# Patient Record
Sex: Male | Born: 1965 | Race: White | Hispanic: No | Marital: Married | Smoking: Current every day smoker
Health system: Southern US, Community
[De-identification: ages and names within clinical notes are randomized; demographics above are authoritative.]

## PROBLEM LIST (undated history)

## (undated) DIAGNOSIS — I10 Essential (primary) hypertension: Secondary | ICD-10-CM

## (undated) DIAGNOSIS — G459 Transient cerebral ischemic attack, unspecified: Secondary | ICD-10-CM

## (undated) DIAGNOSIS — K219 Gastro-esophageal reflux disease without esophagitis: Secondary | ICD-10-CM

## (undated) DIAGNOSIS — R0600 Dyspnea, unspecified: Secondary | ICD-10-CM

## (undated) DIAGNOSIS — Z8719 Personal history of other diseases of the digestive system: Secondary | ICD-10-CM

## (undated) DIAGNOSIS — R519 Headache, unspecified: Secondary | ICD-10-CM

## (undated) DIAGNOSIS — G4730 Sleep apnea, unspecified: Secondary | ICD-10-CM

## (undated) DIAGNOSIS — T8859XA Other complications of anesthesia, initial encounter: Secondary | ICD-10-CM

## (undated) DIAGNOSIS — I639 Cerebral infarction, unspecified: Secondary | ICD-10-CM

## (undated) HISTORY — PX: HERNIA REPAIR: SHX51

## (undated) HISTORY — PX: CHOLECYSTECTOMY: SHX55

## (undated) HISTORY — DX: Cerebral infarction, unspecified: I63.9

## (undated) HISTORY — PX: TONSILLECTOMY: SUR1361

## (undated) HISTORY — PX: LAMINECTOMY AND MICRODISCECTOMY SPINE: SHX1914

## (undated) HISTORY — PX: HIATAL HERNIA REPAIR: SHX195

## (undated) HISTORY — PX: ANEURYSM COILING: SHX5349

## (undated) HISTORY — PX: BACK SURGERY: SHX140

---

## 2014-09-19 DIAGNOSIS — Q248 Other specified congenital malformations of heart: Secondary | ICD-10-CM

## 2014-09-19 HISTORY — DX: Other specified congenital malformations of heart: Q24.8

## 2019-02-17 ENCOUNTER — Observation Stay (HOSPITAL_COMMUNITY)
Admission: EM | Admit: 2019-02-17 | Discharge: 2019-02-18 | Disposition: A | Discharge status: Home / Self Care | Payer: No Typology Code available for payment source | Attending: Neurosurgery | Admitting: Neurosurgery

## 2019-02-17 ENCOUNTER — Encounter (HOSPITAL_COMMUNITY): Payer: Self-pay | Admitting: Neurosurgery

## 2019-02-17 ENCOUNTER — Other Ambulatory Visit: Payer: Self-pay

## 2019-02-17 ENCOUNTER — Emergency Department (HOSPITAL_COMMUNITY): Payer: No Typology Code available for payment source

## 2019-02-17 DIAGNOSIS — Z7982 Long term (current) use of aspirin: Secondary | ICD-10-CM | POA: Insufficient documentation

## 2019-02-17 DIAGNOSIS — R0600 Dyspnea, unspecified: Secondary | ICD-10-CM | POA: Insufficient documentation

## 2019-02-17 DIAGNOSIS — Z9889 Other specified postprocedural states: Secondary | ICD-10-CM | POA: Diagnosis not present

## 2019-02-17 DIAGNOSIS — Z885 Allergy status to narcotic agent status: Secondary | ICD-10-CM | POA: Insufficient documentation

## 2019-02-17 DIAGNOSIS — R609 Edema, unspecified: Secondary | ICD-10-CM | POA: Diagnosis not present

## 2019-02-17 DIAGNOSIS — Z79899 Other long term (current) drug therapy: Secondary | ICD-10-CM | POA: Insufficient documentation

## 2019-02-17 DIAGNOSIS — Z888 Allergy status to other drugs, medicaments and biological substances status: Secondary | ICD-10-CM | POA: Diagnosis not present

## 2019-02-17 DIAGNOSIS — Z20822 Contact with and (suspected) exposure to covid-19: Secondary | ICD-10-CM | POA: Insufficient documentation

## 2019-02-17 DIAGNOSIS — R1310 Dysphagia, unspecified: Secondary | ICD-10-CM | POA: Diagnosis present

## 2019-02-17 LAB — BASIC METABOLIC PANEL
Anion gap: 10 (ref 5–15)
BUN: 15 mg/dL (ref 6–20)
CO2: 23 mmol/L (ref 22–32)
Calcium: 8.9 mg/dL (ref 8.9–10.3)
Chloride: 100 mmol/L (ref 98–111)
Creatinine, Ser: 0.95 mg/dL (ref 0.61–1.24)
GFR calc Af Amer: >60 mL/min (ref >60)
GFR calc non Af Amer: >60 mL/min (ref >60)
Glucose, Bld: 97 mg/dL (ref 70–99)
Potassium: 4.1 mmol/L (ref 3.5–5.1)
Sodium: 133 mmol/L — ABNORMAL LOW (ref 135–145)

## 2019-02-17 LAB — CBC
HCT: 50.3 % (ref 39.0–52.0)
Hemoglobin: 17.6 g/dL — ABNORMAL HIGH (ref 13.0–17.0)
MCH: 33 pg (ref 26.0–34.0)
MCHC: 35 g/dL (ref 30.0–36.0)
MCV: 94.4 fL (ref 80.0–100.0)
Platelets: 189 10*3/uL (ref 150–400)
RBC: 5.33 MIL/uL (ref 4.22–5.81)
RDW: 11.8 % (ref 11.5–15.5)
WBC: 12.3 10*3/uL — ABNORMAL HIGH (ref 4.0–10.5)
nRBC: 0 % (ref 0.0–0.2)

## 2019-02-17 LAB — SARS CORONAVIRUS 2 (TAT 6-24 HRS): SARS Coronavirus 2: NEGATIVE

## 2019-02-17 LAB — I-STAT CREATININE, ED: Creatinine, Ser: 0.9 mg/dL (ref 0.61–1.24)

## 2019-02-17 IMAGING — CT CT NECK W/ CM
4 of 6 series · 14 of 33 positions shown, 16 images · IV contrast (omnipaque)
Comparison: Cervical spine radiographs [DATE]

CLINICAL DATA: Neck swelling post neck surgery; mass, lump or
swelling, maxface. Additional history provided: Patient reports
difficulty swallowing since cervical neck fusion 2 days ago with
anterior approach, patient reports developing throat tightness and
feels as if throat is closing.

EXAM:
CT NECK WITH CONTRAST
TECHNIQUE: Multidetector CT imaging of the neck was performed using the
standard protocol following the bolus administration of intravenous
contrast.
CONTRAST:  75mL OMNIPAQUE IOHEXOL 300 MG/ML  SOLN

[Series 3: neck 2.0 st · axial · 0.64mm/px · z∈[-249,-117]mm · 3 of 133 slices shown, 4 images (1 of 3)]
[im 34/133  soft-tissue]
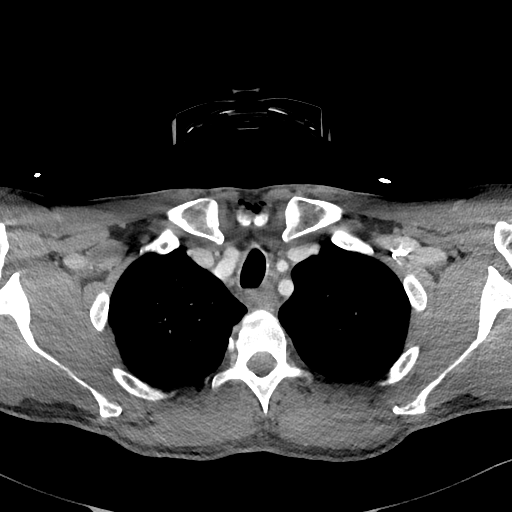
[im 34/133  bone]
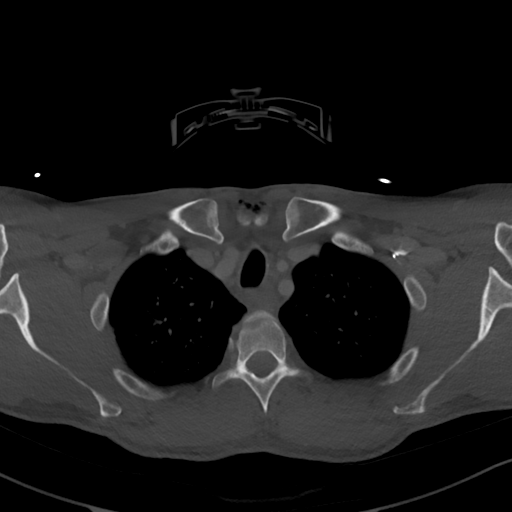
[im 67/133  bone]
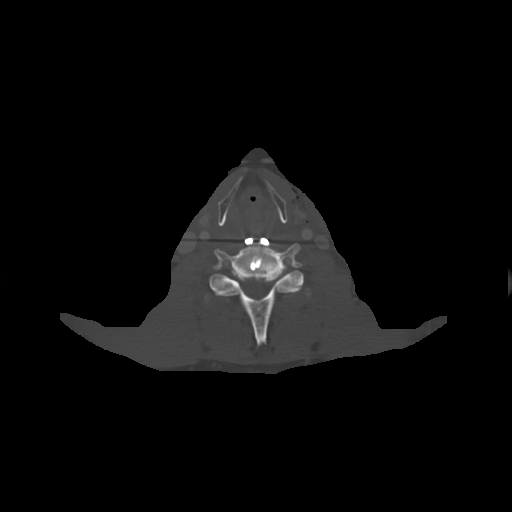
[im 100/133  bone]
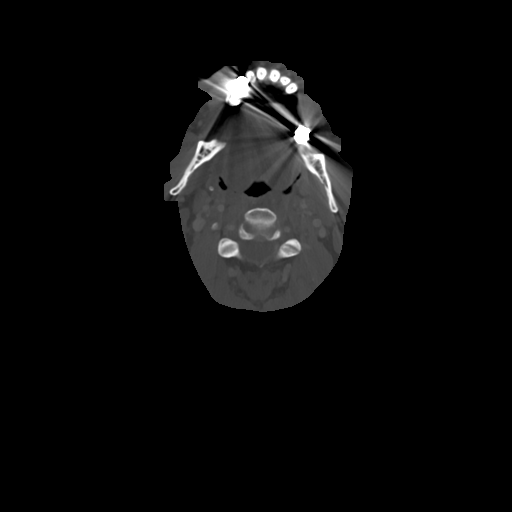

[Series 5: neck 2.0 st · sagittal · 0.52mm/px · 5 of 101 slices shown, 6 images (2 of 3)]
[im 34/101  bone]
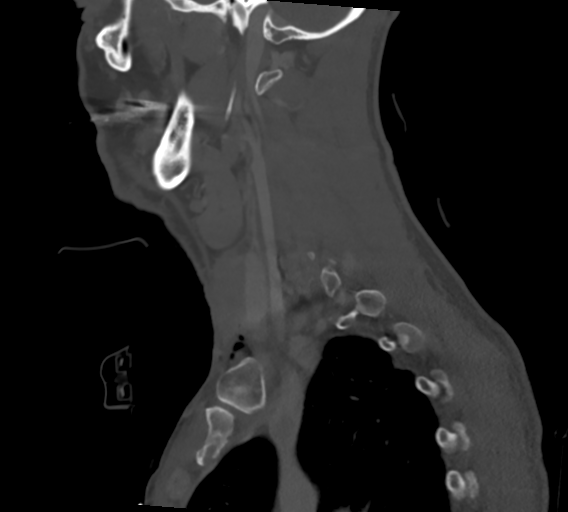
[im 42/101  bone]
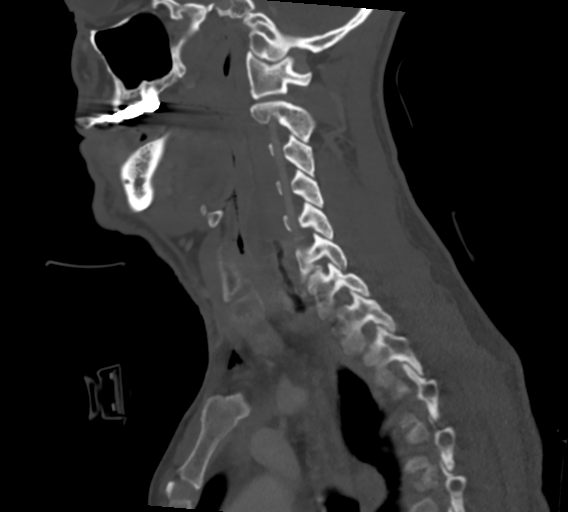
[im 51/101  soft-tissue]
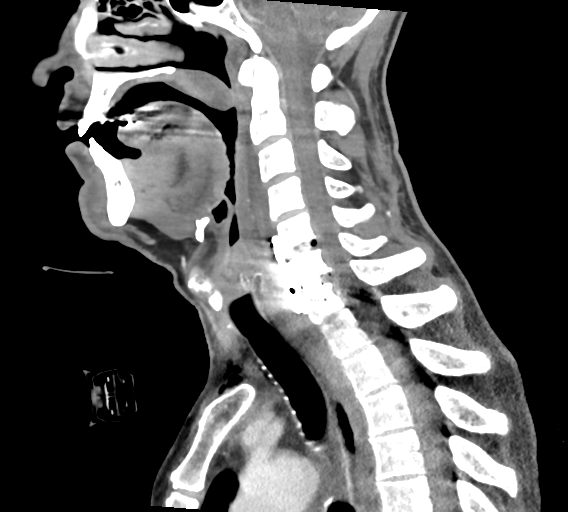
[im 51/101  bone]
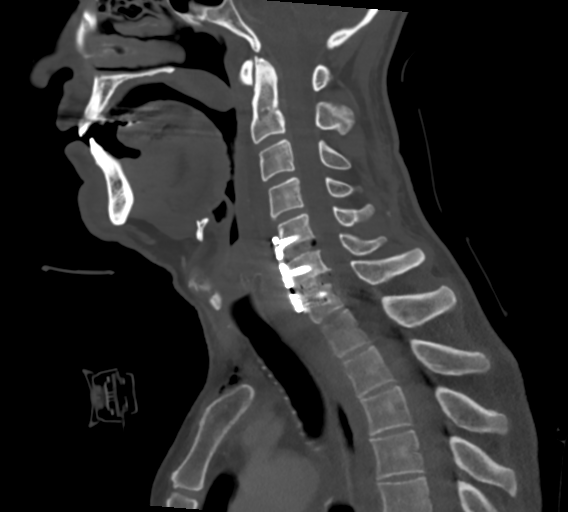
[im 59/101  bone]
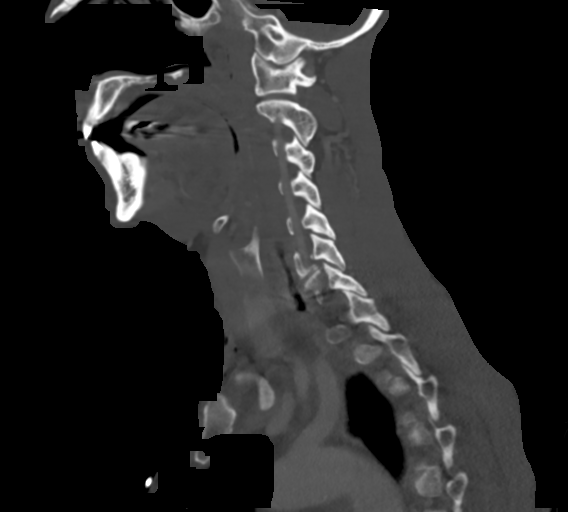
[im 67/101  bone]
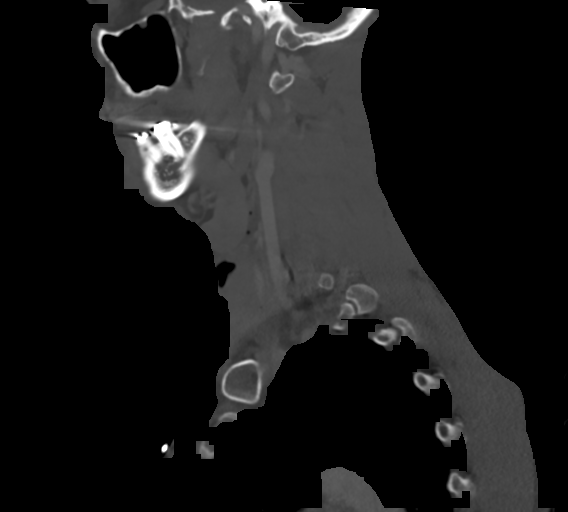

[Series 6: neck 2.0 st · coronal · 0.58mm/px · 3 of 132 slices shown (3 of 3)]
[im 27/132  bone]
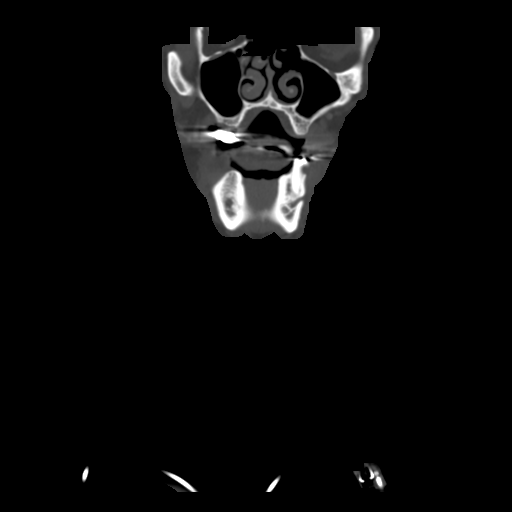
[im 53/132  bone]
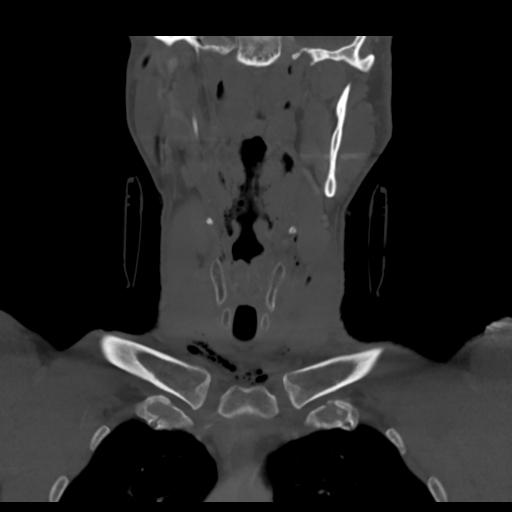
[im 79/132  bone]
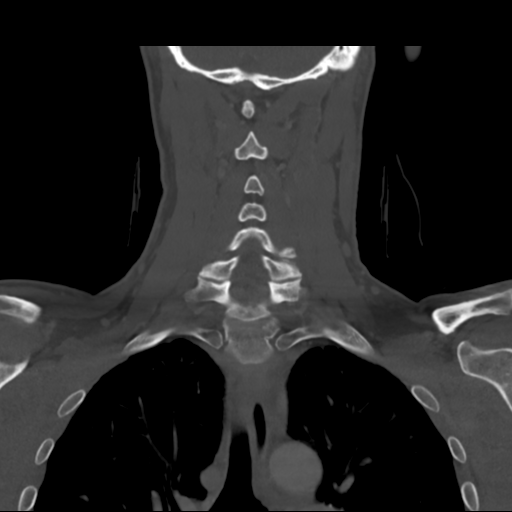

[Series 7: neck 2.0 st orthogonal · axial · 0.39mm/px · z∈[-268,-121]mm · 3 of 149 slices shown]
[im 38/149  bone]
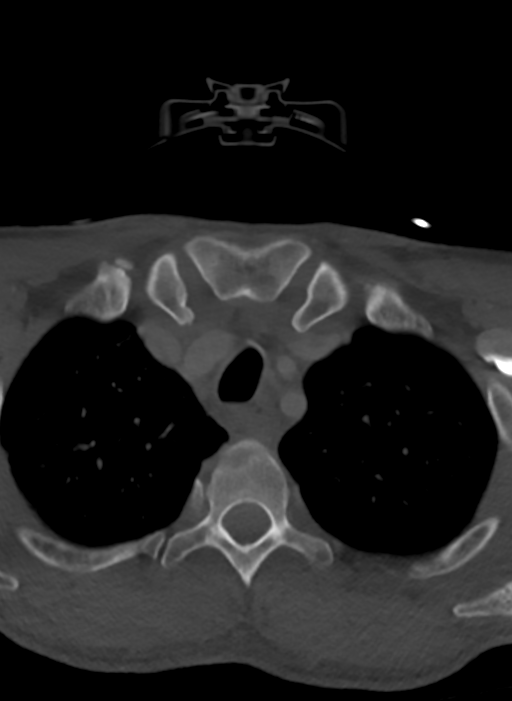
[im 75/149  bone]
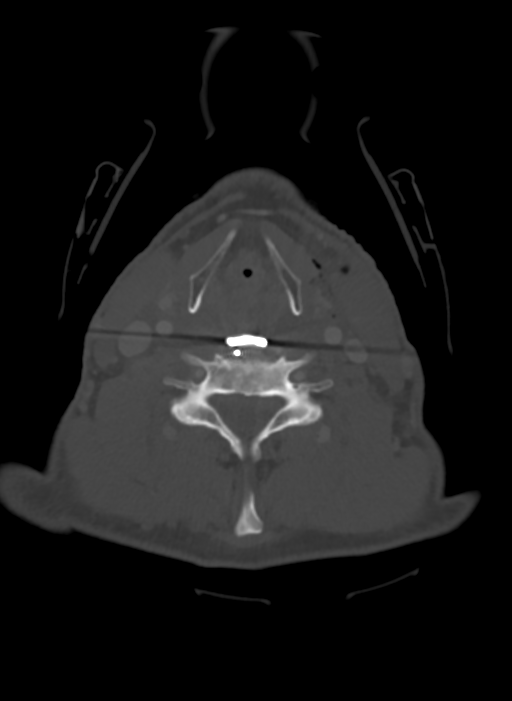
[im 112/149  bone]
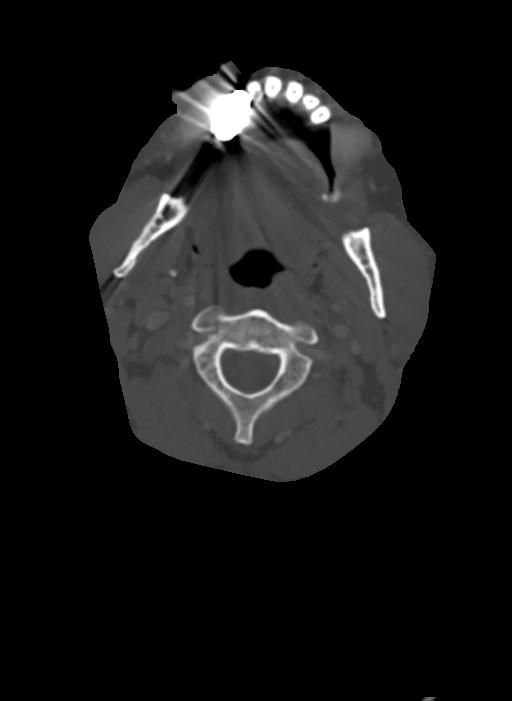

[14 of 33 positions shown; findings below may reference images not displayed]

FINDINGS: Pharynx and larynx:

Streak artifact from ACDF hardware somewhat limits evaluation of the
prevertebral soft tissues at the fused levels.

There is prevertebral soft tissue swelling and prevertebral
edema/hematoma extending from the C2 level caudally to the C7 level.
The prevertebral soft tissues measure up to 15 mm in AP dimension at
the C4 level. There is also prominent swelling of the supraglottic
laryngeal soft tissues. In combination, this results in at least
moderate effacement of the supraglottic airway (series 5, image 49)
(series 3, image 67). There is a small amount of gas within the
C5-C6 and C6-C7 disc spaces from recent procedure.

Salivary glands: No inflammation, mass, or stone.

Thyroid: Unremarkable

Lymph nodes: No pathologically enlarged cervical chain lymph nodes.

Vascular: The major vascular structures of the neck appear patent.

Limited intracranial: No abnormality identified.

Visualized orbits: Visualized orbits demonstrate no acute
abnormality.

Mastoids and visualized paranasal sinuses: Moderate ethmoid sinus
mucosal thickening no significant mastoid effusion.

Skeleton: There has been interval ACDF at the C5-C7 levels. No acute
bony abnormality.

Upper chest: No consolidation within the imaged lung apices.

Other: Scattered gas within the neck soft tissues consistent with
provided history of recent neck surgery.

These results were called by telephone at the time of interpretation
on [DATE] at [DATE] to provider [REDACTED], who verbally
acknowledged these results.
IMPRESSION: Interval C5-C7 ACDF.

Prevertebral soft tissue swelling with prevertebral edema/hematoma
extending from the C2 level caudally to the C7 level. The
prevertebral soft tissues measure up to 15 mm in AP dimension at the
C4 level. There is also prominent swelling of the supraglottic
laryngeal soft tissues in combination, this results in at least
moderate effacement of the supraglottic airway.

## 2019-02-17 MED ORDER — ONDANSETRON HCL 4 MG PO TABS: 4.0000 mg | ORAL_TABLET | Freq: Four times a day (QID) | ORAL | Status: DC | PRN

## 2019-02-17 MED ORDER — BISACODYL 10 MG RE SUPP: 10.0000 mg | Freq: Every day | RECTAL | Status: DC | PRN

## 2019-02-17 MED ORDER — DOCUSATE SODIUM 100 MG PO CAPS
100.0000 mg | ORAL_CAPSULE | Freq: Two times a day (BID) | ORAL | Status: DC
  Administered 2019-02-18: 100 mg via ORAL
  Filled 2019-02-17: qty 1

## 2019-02-17 MED ORDER — DEXAMETHASONE SODIUM PHOSPHATE 10 MG/ML IJ SOLN
10.0000 mg | Freq: Once | INTRAMUSCULAR | Status: AC
  Administered 2019-02-17: 10 mg via INTRAVENOUS
  Filled 2019-02-17: qty 1

## 2019-02-17 MED ORDER — ACETAMINOPHEN 650 MG RE SUPP: 650.0000 mg | Freq: Four times a day (QID) | RECTAL | Status: DC | PRN

## 2019-02-17 MED ORDER — ACETAMINOPHEN 325 MG PO TABS: 650.0000 mg | ORAL_TABLET | Freq: Four times a day (QID) | ORAL | Status: DC | PRN

## 2019-02-17 MED ORDER — POLYETHYLENE GLYCOL 3350 17 G PO PACK: 17.0000 g | PACK | Freq: Every day | ORAL | Status: DC | PRN

## 2019-02-17 MED ORDER — DEXAMETHASONE SODIUM PHOSPHATE 10 MG/ML IJ SOLN: 10.0000 mg | Freq: Once | INTRAMUSCULAR | Status: DC

## 2019-02-17 MED ORDER — DEXAMETHASONE SODIUM PHOSPHATE 4 MG/ML IJ SOLN
4.0000 mg | Freq: Four times a day (QID) | INTRAMUSCULAR | Status: DC
  Administered 2019-02-17 – 2019-02-18 (×4): 4 mg via INTRAVENOUS
  Filled 2019-02-17 (×4): qty 1

## 2019-02-17 MED ORDER — IOHEXOL 300 MG/ML  SOLN
75.0000 mL | Freq: Once | INTRAMUSCULAR | Status: AC | PRN
  Administered 2019-02-17: 75 mL via INTRAVENOUS

## 2019-02-17 MED ORDER — OXYCODONE HCL 5 MG PO TABS
5.0000 mg | ORAL_TABLET | ORAL | Status: DC | PRN
  Administered 2019-02-18: 5 mg via ORAL
  Filled 2019-02-17: qty 1

## 2019-02-17 MED ORDER — GUAIFENESIN 100 MG/5ML PO SOLN
5.0000 mL | ORAL | Status: DC | PRN
  Filled 2019-02-17: qty 5

## 2019-02-17 MED ORDER — SODIUM CHLORIDE 0.9 % IV SOLN: INTRAVENOUS | Status: DC

## 2019-02-17 MED ORDER — FLEET ENEMA 7-19 GM/118ML RE ENEM: 1.0000 | ENEMA | Freq: Once | RECTAL | Status: DC | PRN

## 2019-02-17 MED ORDER — ONDANSETRON HCL 4 MG/2ML IJ SOLN: 4.0000 mg | Freq: Four times a day (QID) | INTRAMUSCULAR | Status: DC | PRN

## 2019-02-17 MED ORDER — VARENICLINE TARTRATE 1 MG PO TABS
1.0000 mg | ORAL_TABLET | Freq: Two times a day (BID) | ORAL | Status: DC
  Administered 2019-02-18: 1 mg via ORAL
  Filled 2019-02-17 (×3): qty 1

## 2019-02-17 MED ORDER — FENTANYL CITRATE (PF) 100 MCG/2ML IJ SOLN
25.0000 ug | Freq: Once | INTRAMUSCULAR | Status: AC
  Administered 2019-02-17: 25 ug via INTRAVENOUS
  Filled 2019-02-17: qty 2

## 2019-02-17 MED ORDER — VARENICLINE TARTRATE 0.5 MG X 11 & 1 MG X 42 PO MISC: 1.0000 | ORAL | Status: DC

## 2019-02-17 NOTE — ED Provider Notes (Signed)
[facility]GENC[facility] DEPARTMENT Provider Note   CSN: [id number] Arrival date & time: 02/17/19  1424     History No chief complaint on file.   Chad Mcintyre is a 54 y.o. male presenting for evaluation of difficulty swallowing and breathing.  Patient states he had surgery on Friday, 2 days ago with Dr. Vertell Limber.  He had a cervical fusion performed.  Since then, he has had gradual worsening feeling that something is pressing on his throat making it hard for him to swallow and breathe.  Patient states he is choking on water.  He reports discomfort at the area, mostly when he tries to swallow.  He was able to take a Tylenol 3 this morning, was not able to take one this afternoon.  He denies fevers, chills, nausea, vomiting.  He called Dr. Melven Sartorius office, and it was told to come to the ER for further evaluation.  He is not currently on any blood thinners, his aspirin was stopped 1 week prior to his surgery.  HPI     No past medical history on file.  Patient Active Problem List   Diagnosis Date Noted  . Dysphagia 02/17/2019        No family history on file.  Social History   Tobacco Use  . Smoking status: Not on file  Substance Use Topics  . Alcohol use: Not on file  . Drug use: Not on file    Home Medications Prior to Admission medications   Medication Sig Start Date End Date Taking? Authorizing Provider  acetaminophen-codeine (TYLENOL #3) 300-30 MG tablet Take 1 tablet by mouth every 8 (eight) hours as needed for moderate pain.   Yes [provider]  aspirin 81 MG EC tablet Take 81 mg by mouth daily.   Yes [provider]  methocarbamol (ROBAXIN) 500 MG tablet Take 500 mg by mouth 4 (four) times daily.   Yes [provider]  varenicline (CHANTIX STARTING MONTH PAK) 0.5 MG X 11 & 1 MG X 42 tablet Take 0.5-1 mg by mouth See admin instructions. Take one 0.5 mg tablet by mouth once daily for 3 days, then increase to one 0.5 mg tablet twice  daily for 4 days, then increase to one 1 mg tablet twice daily.   Yes [provider]    Allergies    Oxycodone-acetaminophen and Valproic acid  Review of Systems   Review of Systems  HENT:       Difficulty swallowing  Musculoskeletal: Positive for neck pain (Anterior).  All other systems reviewed and are negative.   Physical Exam Updated Vital Signs BP (!) 133/92   Pulse 99   Temp 99.2 F (37.3 C) (Oral)   Resp (!) 24   SpO2 92%   Physical Exam Vitals and nursing note reviewed.  Constitutional:      Appearance: He is well-developed.     Comments: Appears uncomfortable  HENT:     Head: Normocephalic.     Comments: Choosing to spit up secretions instead of swallow due to difficulty swallowing.  Airway intact and that patient is able to speak, but voice is hoarse, and patient has pain with speaking. Eyes:     Conjunctiva/sclera: Conjunctivae normal.     Pupils: Pupils are equal, round, and reactive to light.  Neck:      Comments: Tenderness and swelling, mostly of the right anterior neck.  No warmth or erythema to indicate infection Cardiovascular:     Rate and Rhythm: Regular rhythm.  Tachycardia present.     Pulses: Normal pulses.     Comments: Mildly tachycardic 105 Pulmonary:     Effort: Pulmonary effort is normal. No respiratory distress.     Breath sounds: Normal breath sounds. No wheezing.     Comments: Clear lung sounds.  No stridor Abdominal:     General: There is no distension.     Palpations: Abdomen is soft. There is no mass.     Tenderness: There is no abdominal tenderness. There is no guarding or rebound.  Musculoskeletal:        General: Normal range of motion.     Cervical back: Normal range of motion and neck supple.  Skin:    General: Skin is warm and dry.  Neurological:     Mental Status: He is alert and oriented to person, place, and time.     ED Results / Procedures / Treatments   Labs (all labs ordered are listed, but only  abnormal results are displayed) Labs Reviewed  CBC - Abnormal; Notable for the following components:      Result Value   WBC 12.3 (*)    Hemoglobin 17.6 (*)    All other components within normal limits  BASIC METABOLIC PANEL - Abnormal; Notable for the following components:   Sodium 133 (*)    All other components within normal limits  SARS CORONAVIRUS 2 (TAT 6-24 HRS)  HIV ANTIBODY (ROUTINE TESTING W REFLEX)  I-STAT CREATININE, ED    EKG None  Radiology CT Soft Tissue Neck W Contrast  Result Date: 02/17/2019 CLINICAL DATA:  Neck swelling post neck surgery; mass, lump or swelling, maxface. Additional history provided: Patient reports difficulty swallowing since cervical neck fusion 2 days ago with anterior approach, patient reports developing throat tightness and feels as if throat is closing. EXAM: CT NECK WITH CONTRAST TECHNIQUE: Multidetector CT imaging of the neck was performed using the standard protocol following the bolus administration of intravenous contrast. CONTRAST:  63mL OMNIPAQUE IOHEXOL 300 MG/ML  SOLN COMPARISON:  Cervical spine radiographs 02/15/2019 FINDINGS: Pharynx and larynx: Streak artifact from ACDF hardware somewhat limits evaluation of the prevertebral soft tissues at the fused levels. There is prevertebral soft tissue swelling and prevertebral edema/hematoma extending from the C2 level caudally to the C7 level. The prevertebral soft tissues measure up to 15 mm in AP dimension at the C4 level. There is also prominent swelling of the supraglottic laryngeal soft tissues. In combination, this results in at least moderate effacement of the supraglottic airway (series 5, image 49) (series 3, image 67). There is a small amount of gas within the C5-C6 and C6-C7 disc spaces from recent procedure. Salivary glands: No inflammation, mass, or stone. Thyroid: Unremarkable Lymph nodes: No pathologically enlarged cervical chain lymph nodes. Vascular: The major vascular structures of  the neck appear patent. Limited intracranial: No abnormality identified. Visualized orbits: Visualized orbits demonstrate no acute abnormality. Mastoids and visualized paranasal sinuses: Moderate ethmoid sinus mucosal thickening no significant mastoid effusion. Skeleton: There has been interval ACDF at the C5-C7 levels. No acute bony abnormality. Upper chest: No consolidation within the imaged lung apices. Other: Scattered gas within the neck soft tissues consistent with provided history of recent neck surgery. These results were called by telephone at the time of interpretation on 02/17/2019 at 4:15 pm to provider PA Caccavali, who verbally acknowledged these results. IMPRESSION: Interval C5-C7 ACDF. Prevertebral soft tissue swelling with prevertebral edema/hematoma extending from the C2 level caudally to the C7 level. The prevertebral soft  tissues measure up to 15 mm in AP dimension at the C4 level. There is also prominent swelling of the supraglottic laryngeal soft tissues in combination, this results in at least moderate effacement of the supraglottic airway. Electronically Signed   By: Jackey Loge DO   On: 02/17/2019 16:16    Procedures Procedures (including critical care time)  Medications Ordered in ED Medications  varenicline (CHANTIX PAK) tablet 1 tablet (has no administration in time range)  0.9 %  sodium chloride infusion (has no administration in time range)  acetaminophen (TYLENOL) tablet 650 mg (has no administration in time range)    Or  acetaminophen (TYLENOL) suppository 650 mg (has no administration in time range)  oxyCODONE (Oxy IR/ROXICODONE) immediate release tablet 5 mg (has no administration in time range)  docusate sodium (COLACE) capsule 100 mg (has no administration in time range)  polyethylene glycol (MIRALAX / GLYCOLAX) packet 17 g (has no administration in time range)  bisacodyl (DULCOLAX) suppository 10 mg (has no administration in time range)  sodium phosphate (FLEET)  7-19 GM/118ML enema 1 enema (has no administration in time range)  ondansetron (ZOFRAN) tablet 4 mg (has no administration in time range)    Or  ondansetron (ZOFRAN) injection 4 mg (has no administration in time range)  guaiFENesin (ROBITUSSIN) 100 MG/5ML solution 100 mg (has no administration in time range)  dexamethasone (DECADRON) injection 10 mg (has no administration in time range)  dexamethasone (DECADRON) injection 4 mg (has no administration in time range)  fentaNYL (SUBLIMAZE) injection 25 mcg (25 mcg Intravenous Given 02/17/19 1518)  dexamethasone (DECADRON) injection 10 mg (10 mg Intravenous Given 02/17/19 1518)  iohexol (OMNIPAQUE) 300 MG/ML solution 75 mL (75 mLs Intravenous Contrast Given 02/17/19 1551)    ED Course  I have reviewed the triage vital signs and the nursing notes.  Pertinent labs & imaging results that were available during my care of the patient were reviewed by me and considered in my medical decision making (see chart for details).    MDM Rules/Calculators/A&P                      Patient presenting for evaluation of increased difficulty breathing and swallowing status post cervical fusion with an anterior approach.  Physical exam shows patient who appears uncomfortable.  No stridor or imminent airway decline, however I am concerned about his difficulty breathing and swallowing.  Will obtain labs and CT for further evaluation.  Fentanyl and Decadron given for pain and swelling control while labs and CT are pending.  On reassessment, patient reports no significant change with medication.  CT shows prevertebral edema/hematoma of the soft tissue causing moderate effacement of the supraglottic airway.  Will consult with neurosurgery.  Discussed with V Costella, PA-C from neurosurgery. Pt to be admitted.   Final Clinical Impression(s) / ED Diagnoses Final diagnoses:  Postoperative edema  Dysphagia, unspecified type    Rx / DC Orders ED Discharge Orders     None       Alveria Apley, PA-C 02/17/19 1949    Benjiman Core, MD 02/17/19 1956

## 2019-02-17 NOTE — ED Notes (Signed)
Pt transported to 4N09 via cart by this RN. Pt on continuous monitoring via blood pressure, pulse ox, and cardiac monitor. No distress noted. Pt care endorsed to [facility]. All belongings with pt.

## 2019-02-17 NOTE — H&P (Signed)
Chief Complaint   No chief complaint on file.   HPI   Consult requested by: Fabio Bering, [facility] ED Reason for consult: dysphagia  HPI: Chad Mcintyre is a 54 y.o. male who presents with worsening dysphagia. He is s/p C5-6 C6-7 ACDF by Dr Vertell Limber 02/13/2018. Surgery was performed at the outpatient surgery center. He has noted since surgery, progressive worsening dysphagia. Initially he was able to tolerate soft foods. Liquids and pills, but as of today, has not been able to tolerate really anything by mouth with the exception of small amounts of liquid. He also feels as though he constantly wants to "spit up" phlegm. He is a smoker, working on quitting. No significant dyspnea. He otherwise is doing well compared to pre op. Has mild bilateral shoulder pain at present, nondebilitating. Improved LUE weakness at tricep. No concerns with incision. Wearing aspen collar as instructed. Denies bowel/bladder dysfunction. EDP obtained CT neck which showed moderate edema. NSY called for recommendations. Patient appears well, nontoxic, satting normal on RA.   There are no problems to display for this patient.   PMH: No past medical history on file.  PSH: (Not in a hospital admission)   SH: Social History   Tobacco Use  . Smoking status: Not on file  Substance Use Topics  . Alcohol use: Not on file  . Drug use: Not on file    MEDS: Prior to Admission medications   Medication Sig Start Date End Date Taking? Authorizing Provider  acetaminophen-codeine (TYLENOL #3) 300-30 MG tablet Take 1 tablet by mouth every 8 (eight) hours as needed for moderate pain.   Yes [provider]  aspirin 81 MG EC tablet Take 81 mg by mouth daily.   Yes [provider]  methocarbamol (ROBAXIN) 500 MG tablet Take 500 mg by mouth 4 (four) times daily.   Yes [provider]  varenicline (CHANTIX STARTING MONTH PAK) 0.5 MG X 11 & 1 MG X 42 tablet Take 0.5-1 mg by mouth See admin  instructions. Take one 0.5 mg tablet by mouth once daily for 3 days, then increase to one 0.5 mg tablet twice daily for 4 days, then increase to one 1 mg tablet twice daily.   Yes [provider]    ALLERGY: Allergies  Allergen Reactions  . Oxycodone-Acetaminophen Nausea And Vomiting  . Valproic Acid Other (See Comments)    aggression     Social History   Tobacco Use  . Smoking status: Not on file  Substance Use Topics  . Alcohol use: Not on file     No family history on file.   ROS   Review of Systems  Constitutional: Negative.   HENT: Negative.   Eyes: Negative.   Respiratory: Positive for cough and sputum production (clear).   Cardiovascular: Negative.   Gastrointestinal: Negative for nausea and vomiting.       Positive dysphagia  Musculoskeletal: Positive for neck pain.  Skin: Negative.   Neurological: Negative for dizziness, tingling, tremors, sensory change, speech change, focal weakness, seizures, loss of consciousness, weakness and headaches.    Exam   Vitals:   02/17/19 1600 02/17/19 1700  BP: 123/87 (!) 117/92  Pulse: 93 89  Resp: (!) 26 18  Temp:    SpO2: 93% 92%   General appearance: WDWN, NAD, nontoxic, resting comfotably Eyes: No scleral injection Cardiovascular: Regular rate and rhythm without murmurs, rubs, gallops. No edema or variciosities. Distal pulses normal. Pulmonary: Effort normal, non-labored breathing Musculoskeletal:  Muscle tone upper extremities: Normal    Muscle tone lower extremities: Normal    Motor exam: Upper Extremities Deltoid Bicep Tricep Grip  Right 5/5 5/5 5/5 5/5  Left 5/5 5/5 5/5 5/5   Lower Extremity IP Quad PF DF EHL  Right 5/5 5/5 5/5 5/5 5/5  Left 5/5 5/5 5/5 5/5 5/5   Neurological Mental Status:    - Patient is awake, alert, oriented to person, place, month, year, and situation    - Patient is able to give a clear and coherent history.    - No signs of aphasia or neglect Cranial Nerves    -  II: Visual Fields are full. PERRL    - III/IV/VI: EOMI without ptosis or diploplia.     - V: Facial sensation is grossly normal    - VII: Facial movement is symmetric.     - VIII: hearing is intact to voice    - X: Uvula elevates symmetrically    - XI: Shoulder shrug is symmetric.    - XII: tongue is midline without atrophy or fasciculations.  Sensory: Sensation grossly intact to LT Incision: c/d/i, no significant palpable hematoma   Results - Imaging/Labs   Results for orders placed or performed during the hospital encounter of 02/17/19 (from the past 48 hour(s))  CBC     Status: Abnormal   Collection Time: 02/17/19  3:24 PM  Result Value Ref Range   WBC 12.3 (H) 4.0 - 10.5 K/uL   RBC 5.33 4.22 - 5.81 MIL/uL   Hemoglobin 17.6 (H) 13.0 - 17.0 g/dL   HCT 84.1 32.4 - 40.1 %   MCV 94.4 80.0 - 100.0 fL   MCH 33.0 26.0 - 34.0 pg   MCHC 35.0 30.0 - 36.0 g/dL   RDW 02.7 25.3 - 66.4 %   Platelets 189 150 - 400 K/uL   nRBC 0.0 0.0 - 0.2 %    Comment: Performed at [facility], [address], [place], [place] [address]  Basic metabolic panel     Status: Abnormal   Collection Time: 02/17/19  3:24 PM  Result Value Ref Range   Sodium 133 (L) 135 - 145 mmol/L   Potassium 4.1 3.5 - 5.1 mmol/L   Chloride 100 98 - 111 mmol/L   CO2 23 22 - 32 mmol/L   Glucose, Bld 97 70 - 99 mg/dL   BUN 15 6 - 20 mg/dL   Creatinine, Ser 4.25 0.61 - 1.24 mg/dL   Calcium 8.9 8.9 - 95.6 mg/dL   GFR calc non Af Amer >60 >60 mL/min   GFR calc Af Amer >60 >60 mL/min   Anion gap 10 5 - 15    Comment: Performed at [facility], [address], [place], [place] [address]  I-Stat Creatinine, ED (not at [facility])     Status: None   Collection Time: 02/17/19  3:29 PM  Result Value Ref Range   Creatinine, Ser 0.90 0.61 - 1.24 mg/dL    CT Soft Tissue Neck W Contrast  Result Date: 02/17/2019 CLINICAL DATA:  Neck swelling post neck surgery; mass, lump or swelling, maxface. Additional history provided:  Patient reports difficulty swallowing since cervical neck fusion 2 days ago with anterior approach, patient reports developing throat tightness and feels as if throat is closing. EXAM: CT NECK WITH CONTRAST TECHNIQUE: Multidetector CT imaging of the neck was performed using the standard protocol following the bolus administration of intravenous contrast. CONTRAST:  6mL OMNIPAQUE IOHEXOL 300  MG/ML  SOLN COMPARISON:  Cervical spine radiographs 02/15/2019 FINDINGS: Pharynx and larynx: Streak artifact from ACDF hardware somewhat limits evaluation of the prevertebral soft tissues at the fused levels. There is prevertebral soft tissue swelling and prevertebral edema/hematoma extending from the C2 level caudally to the C7 level. The prevertebral soft tissues measure up to 15 mm in AP dimension at the C4 level. There is also prominent swelling of the supraglottic laryngeal soft tissues. In combination, this results in at least moderate effacement of the supraglottic airway (series 5, image 49) (series 3, image 67). There is a small amount of gas within the C5-C6 and C6-C7 disc spaces from recent procedure. Salivary glands: No inflammation, mass, or stone. Thyroid: Unremarkable Lymph nodes: No pathologically enlarged cervical chain lymph nodes. Vascular: The major vascular structures of the neck appear patent. Limited intracranial: No abnormality identified. Visualized orbits: Visualized orbits demonstrate no acute abnormality. Mastoids and visualized paranasal sinuses: Moderate ethmoid sinus mucosal thickening no significant mastoid effusion. Skeleton: There has been interval ACDF at the C5-C7 levels. No acute bony abnormality. Upper chest: No consolidation within the imaged lung apices. Other: Scattered gas within the neck soft tissues consistent with provided history of recent neck surgery. These results were called by telephone at the time of interpretation on 02/17/2019 at 4:15 pm to provider PA Caccavali, who verbally  acknowledged these results. IMPRESSION: Interval C5-C7 ACDF. Prevertebral soft tissue swelling with prevertebral edema/hematoma extending from the C2 level caudally to the C7 level. The prevertebral soft tissues measure up to 15 mm in AP dimension at the C4 level. There is also prominent swelling of the supraglottic laryngeal soft tissues in combination, this results in at least moderate effacement of the supraglottic airway. Electronically Signed   By: Jackey Loge DO   On: 02/17/2019 16:16   Impression/Plan   54 y.o. male s/p C5-6 C6-7 ACDF 02/15/2019 with Dr Venetia Maxon who presents with progressive, worsening dysphagia. He is neurologically intact. CT neck shows moderate prevertebral edema. He is well appearing, nontoxic, satting on RA >95% during my entire examination. Given how well he appears to be doing clinically, with exception of dysphagia, there is no indication for emergent NS intervention. Will admit step down unit for monitoring and start on IV steroids.  - decadron 10mg  now, then 4mg  q 6 hours - NPO until tomorrow am, at which time we can re-evaluate  Plan communicated with both patient and wife.   , PA-C Neurosurgery and Cindra Presume

## 2019-02-17 NOTE — ED Triage Notes (Addendum)
Patient complains of difficulty swallowing since cervical neck fusion 2 days ago with anterior approach. Patient has developed throat tightness and feels as if throat closing. Hoarseness noted. Arrived with c-collar in place.

## 2019-02-17 NOTE — ED Notes (Signed)
This RN tried to give report by unsuccessful.

## 2019-02-17 NOTE — ED Notes (Signed)
Unsuccessful attemp to give report

## 2019-02-18 LAB — SURGICAL PCR SCREEN
MRSA, PCR: NEGATIVE
Staphylococcus aureus: NEGATIVE

## 2019-02-18 LAB — HIV ANTIBODY (ROUTINE TESTING W REFLEX): HIV Screen 4th Generation wRfx: NONREACTIVE

## 2019-02-18 NOTE — Progress Notes (Addendum)
Subjective: Patient reports "I feel good. I can swallow now without hurting and my neck doesn't feel swollen anymore"  Objective: Vital signs in last 24 hours: Temp:  [97.6 F (36.4 C)-99.2 F (37.3 C)] 97.6 F (36.4 C) (01/25 0326) Pulse Rate:  [82-121] 82 (01/25 0200) Resp:  [14-26] 20 (01/24 2328) BP: (93-133)/(76-96) 117/95 (01/24 2328) SpO2:  [87 %-95 %] 95 % (01/25 0200) Weight:  [80.2 kg] 80.2 kg (01/24 2051)  Intake/Output from previous day: 01/24 0701 - 01/25 0700 In: 539.6 [I.V.:539.6] Out: 300 [Urine:300] Intake/Output this shift: No intake/output data recorded.  Awakens to voice. Clears throat, speaking fluently, reporting no issues with secretions this morning. Remarks that neck tightness/swelling has resolved. Inspection reveals flat incision beneath Telfa and Tegaderm. No erythema or drainage. Vista collar in use. Full strength BUE. Has not required pain medications.   Lab Results: Recent Labs    02/17/19 1524  WBC 12.3*  HGB 17.6*  HCT 50.3  PLT 189   BMET Recent Labs    02/17/19 1524 02/17/19 1529  NA 133*  --   K 4.1  --   CL 100  --   CO2 23  --   GLUCOSE 97  --   BUN 15  --   CREATININE 0.95 0.90  CALCIUM 8.9  --     Studies/Results: CT Soft Tissue Neck W Contrast  Result Date: 02/17/2019 CLINICAL DATA:  Neck swelling post neck surgery; mass, lump or swelling, maxface. Additional history provided: Patient reports difficulty swallowing since cervical neck fusion 2 days ago with anterior approach, patient reports developing throat tightness and feels as if throat is closing. EXAM: CT NECK WITH CONTRAST TECHNIQUE: Multidetector CT imaging of the neck was performed using the standard protocol following the bolus administration of intravenous contrast. CONTRAST:  54mL OMNIPAQUE IOHEXOL 300 MG/ML  SOLN COMPARISON:  Cervical spine radiographs 02/15/2019 FINDINGS: Pharynx and larynx: Streak artifact from ACDF hardware somewhat limits evaluation of the  prevertebral soft tissues at the fused levels. There is prevertebral soft tissue swelling and prevertebral edema/hematoma extending from the C2 level caudally to the C7 level. The prevertebral soft tissues measure up to 15 mm in AP dimension at the C4 level. There is also prominent swelling of the supraglottic laryngeal soft tissues. In combination, this results in at least moderate effacement of the supraglottic airway (series 5, image 49) (series 3, image 67). There is a small amount of gas within the C5-C6 and C6-C7 disc spaces from recent procedure. Salivary glands: No inflammation, mass, or stone. Thyroid: Unremarkable Lymph nodes: No pathologically enlarged cervical chain lymph nodes. Vascular: The major vascular structures of the neck appear patent. Limited intracranial: No abnormality identified. Visualized orbits: Visualized orbits demonstrate no acute abnormality. Mastoids and visualized paranasal sinuses: Moderate ethmoid sinus mucosal thickening no significant mastoid effusion. Skeleton: There has been interval ACDF at the C5-C7 levels. No acute bony abnormality. Upper chest: No consolidation within the imaged lung apices. Other: Scattered gas within the neck soft tissues consistent with provided history of recent neck surgery. These results were called by telephone at the time of interpretation on 02/17/2019 at 4:15 pm to provider PA Caccavali, who verbally acknowledged these results. IMPRESSION: Interval C5-C7 ACDF. Prevertebral soft tissue swelling with prevertebral edema/hematoma extending from the C2 level caudally to the C7 level. The prevertebral soft tissues measure up to 15 mm in AP dimension at the C4 level. There is also prominent swelling of the supraglottic laryngeal soft tissues in combination, this results in  at least moderate effacement of the supraglottic airway. Electronically Signed   By: Kellie Simmering DO   On: 02/17/2019 16:16    Assessment/Plan: improving  LOS: 0 days  Will plan  to advance diet this am. Up ad lib.    Verdis Prime 02/18/2019, 7:39 AM  If continuing to improve this morning, he may be discharged home this afternoon.

## 2019-02-18 NOTE — Discharge Instructions (Signed)
Take 4mg  of decadron twice a day for 2 days then once a day for 2 days.

## 2019-02-18 NOTE — Progress Notes (Signed)
Pt with d/c orders. Discharge paperwork reviewed with pt and wife and all questions answered. IV removed. Pt escorted out to vehicle with all belongings.

## 2019-02-18 NOTE — Discharge Summary (Signed)
Physician Discharge Summary  Patient ID: Chad Mcintyre MRN: [id number] DOB/AGE: September 08, 1965 54 y.o.  Admit date: 02/17/2019 Discharge date: 02/18/2019  Admission Diagnoses: Dysphagia  Discharge Diagnoses: Dysphagia, improved Active Problems:   Dysphagia   Discharged Condition: Improved  Hospital Course: Chad Mcintyre was admitted 02/17/19 with dysphagia following 2 level ACDF on 02/15/19.  I.V. steroids were initiated and symptoms were alleviated quickly. He has been able to advance his diet without difficulty.    Consults: None  Significant Diagnostic Studies:   Treatments: steroids: decadron IV  Discharge Exam: Blood pressure 117/82, pulse 90, temperature 98.4 F (36.9 C), temperature source Oral, resp. rate 20, height 6\' 1"  (1.854 m), weight 80.2 kg, SpO2 93 %. Alert and oriented. Conversant with fluent speech, reporting decreased sensation of neck swelling. Advancing diet without difficulty. Fuul strength BUE. Minimal neck and shoulder pain reports.  Disposition: Discharge disposition: 01-Home or Self Care Discharge to home. Office f/u in 3 weeks with x-ray. Decadron 4mg  p.o. bid x2days starting today, then QD x 2 days and stop. Continue pain meds and muscle relaxer already rx'ed for home use.        Discharge Instructions    Diet - low sodium heart healthy   Complete by: As directed    Increase activity slowly   Complete by: As directed      Allergies as of 02/18/2019      Reactions   Oxycodone-acetaminophen Nausea And Vomiting   Valproic Acid Other (See Comments)   aggression      Medication List    TAKE these medications   acetaminophen-codeine 300-30 MG tablet Commonly known as: TYLENOL #3 Take 1 tablet by mouth every 8 (eight) hours as needed for moderate pain.   aspirin 81 MG EC tablet Take 81 mg by mouth daily.   Chantix Starting Month Pak 0.5 MG X 11 & 1 MG X 42 tablet Generic drug: varenicline Take 0.5-1 mg by mouth See admin  instructions. Take one 0.5 mg tablet by mouth once daily for 3 days, then increase to one 0.5 mg tablet twice daily for 4 days, then increase to one 1 mg tablet twice daily.   methocarbamol 500 MG tablet Commonly known as: ROBAXIN Take 500 mg by mouth 4 (four) times daily.        Signed: , MD 02/18/2019, 3:57 PM

## 2019-11-05 ENCOUNTER — Other Ambulatory Visit: Payer: Self-pay | Admitting: Neurosurgery

## 2019-11-05 ENCOUNTER — Other Ambulatory Visit (HOSPITAL_COMMUNITY): Payer: Self-pay | Admitting: Neurosurgery

## 2019-11-05 DIAGNOSIS — M50222 Other cervical disc displacement at C5-C6 level: Secondary | ICD-10-CM

## 2019-11-18 ENCOUNTER — Encounter: Payer: Self-pay | Admitting: Neurology

## 2019-11-18 ENCOUNTER — Ambulatory Visit (INDEPENDENT_AMBULATORY_CARE_PROVIDER_SITE_OTHER): Admitting: Neurology

## 2019-11-18 ENCOUNTER — Ambulatory Visit (INDEPENDENT_AMBULATORY_CARE_PROVIDER_SITE_OTHER): Payer: No Typology Code available for payment source | Admitting: Neurology

## 2019-11-18 ENCOUNTER — Other Ambulatory Visit: Payer: Self-pay

## 2019-11-18 DIAGNOSIS — G5603 Carpal tunnel syndrome, bilateral upper limbs: Secondary | ICD-10-CM | POA: Diagnosis not present

## 2019-11-18 DIAGNOSIS — R202 Paresthesia of skin: Secondary | ICD-10-CM

## 2019-11-18 HISTORY — DX: Paresthesia of skin: R20.2

## 2019-11-18 NOTE — Progress Notes (Signed)
Please refer to EMG and nerve conduction procedure note.  

## 2019-11-18 NOTE — Progress Notes (Signed)
MNC    Nerve / Sites Muscle Latency Ref. Amplitude Ref. Rel Amp Segments Distance Velocity Ref. Area    ms ms mV mV %  cm m/s m/s mVms  L Median - APB     Wrist APB 3.9 ?4.4 6.6 ?4.0 100 Wrist - APB 7   25.7     Upper arm APB 8.3  5.1  76.5 Upper arm - Wrist 23 53 ?49 18.9  R Median - APB     Wrist APB 3.7 ?4.4 6.9 ?4.0 100 Wrist - APB 7   29.1     Upper arm APB 8.2  6.4  92.5 Upper arm - Wrist 25 55 ?49 27.7  L Ulnar - ADM     Wrist ADM 3.3 ?3.3 8.2 ?6.0 100 Wrist - ADM 7   28.5     B.Elbow ADM 6.9  7.7  93.7 B.Elbow - Wrist 21 58 ?49 27.8     A.Elbow ADM 8.7  7.1  92.4 A.Elbow - B.Elbow 10 56 ?49 32.4         A.Elbow - Wrist      R Ulnar - ADM     Wrist ADM 3.0 ?3.3 11.3 ?6.0 100 Wrist - ADM 7   37.6     B.Elbow ADM 6.8  10.0  89 B.Elbow - Wrist 23 60 ?49 34.0     A.Elbow ADM 8.5  10.6  106 A.Elbow - B.Elbow 10 59 ?49 37.0         A.Elbow - Wrist                 SNC    Nerve / Sites Rec. Site Peak Lat Ref.  Amp Ref. Segments Distance    ms ms V V  cm  L Radial - Anatomical snuff box (Forearm)     Forearm Wrist 2.6 ?2.9 19 ?15 Forearm - Wrist 10  R Radial - Anatomical snuff box (Forearm)     Forearm Wrist 2.8 ?2.9 32 ?15 Forearm - Wrist 10  L Median - Orthodromic (Dig II, Mid palm)     Dig II Wrist 3.4 ?3.4 9 ?10 Dig II - Wrist 13  R Median - Orthodromic (Dig II, Mid palm)     Dig II Wrist 3.3 ?3.4 8 ?10 Dig II - Wrist 13  L Ulnar - Orthodromic, (Dig V, Mid palm)     Dig V Wrist NR ?3.1 NR ?5 Dig V - Wrist 11  R Ulnar - Orthodromic, (Dig V, Mid palm)     Dig V Wrist NR ?3.1 NR ?5 Dig V - Wrist 56                 F  Wave    Nerve F Lat Ref.   ms ms  L Ulnar - ADM 31.0 ?32.0  R Ulnar - ADM 29.9 ?32.0

## 2019-11-18 NOTE — Procedures (Signed)
     HISTORY:  Chad Mcintyre is a 54 year old gentleman with a history of cervical spine surgery in January 2021.  The patient has had some ongoing bilateral upper extremity numbness that is worse on the left than the right.  He denies any definite weakness of the upper extremities.  He is being evaluated for a possible neuropathy or a cervical radiculopathy.  NERVE CONDUCTION STUDIES:  Nerve conduction studies were performed on both upper extremities. The distal motor latencies and motor amplitudes for the median and ulnar nerves were within normal limits. The nerve conduction velocities for these nerves were also normal. The sensory latencies for the median and radial nerves were normal.  The ulnar sensory latencies were unobtainable bilaterally.  The F wave latencies for the ulnar nerves were within normal limits.   EMG STUDIES:  EMG study was performed on the left upper extremity:  The first dorsal interosseous muscle reveals 2 to 4 K units with full recruitment. No fibrillations or positive waves were noted. The abductor pollicis brevis muscle reveals 2 to 4 K units with full recruitment. No fibrillations or positive waves were noted. The extensor indicis proprius muscle reveals 1 to 3 K units with full recruitment. No fibrillations or positive waves were noted. The pronator teres muscle reveals 2 to 3 K units with full recruitment. No fibrillations or positive waves were noted. The biceps muscle reveals 1 to 2 K units with full recruitment. No fibrillations or positive waves were noted. The triceps muscle reveals 2 to 4 K units with full recruitment. No fibrillations or positive waves were noted. The anterior deltoid muscle reveals 2 to 3 K units with full recruitment. No fibrillations or positive waves were noted. The cervical paraspinal muscles were tested at 2 levels. No abnormalities of insertional activity were seen at either level tested. There was good  relaxation.   IMPRESSION:  Nerve conduction studies done on both upper extremities were within normal limits with exception of absence of ulnar sensory latencies bilaterally.  The clinical significance of this is not clear.  EMG evaluation of the left upper extremity was within normal limits, no evidence of an overlying cervical radiculopathy was seen.  There is no evidence of an ulnar neuropathy by EMG.  Marlan Palau MD 11/18/2019 4:24 PM  [facility] [address] [place], [place] [address]  Phone [phone] Fax [phone]

## 2019-11-22 ENCOUNTER — Encounter (HOSPITAL_COMMUNITY): Payer: Self-pay | Admitting: Vascular Surgery

## 2019-11-22 ENCOUNTER — Encounter (HOSPITAL_COMMUNITY): Payer: Self-pay | Admitting: Neurosurgery

## 2019-11-22 NOTE — Progress Notes (Signed)
---  Same day work-up phone call---  PCP - Tresa Endo, NP (Octavio Mann[place], [place]) Cardiologist - per patient recently released from services Kathryne Sharper, [place])  PPM/ICD - denies  Chest x-ray - denies  EKG/Stress Test/ECHO - per patient done sometime this year - records requested  Cardiac Cath - denies  OSA - Yes, per patient does not wear CPAP  DM: denies  Blood Thinner Instructions: Aspirin Instructions: Last dose 11/21/2019  ERAS Protcol - No  COVID TEST- Scheduled for 11/23/2019. Patient verbalized understanding of self-quarantine instructions, appointment place and time.  Anesthesia review: YES, hx records requested Patient given [facility] H&P completed of 30 day window, Dr. Fredrich Birks office contacted and notified.   Patient denies shortness of breath, fever, cough and chest pain on pre-op phone call.  All instructions explained to the patient, with a verbal understanding of instructions. Patient also instructed to self quarantine after being tested for COVID-19. The opportunity to ask questions was provided.   Your procedure is scheduled on Tuesday, November 2nd.  Report to [facility] Main Entrance "A" at 7:30 A.M., and check in at the Admitting office.  Call this number if you have problems the morning of surgery:  [phone]  Call [phone] if you have any questions prior to your surgery date Monday-Friday 8am-4pm   Remember:  Do not eat or drink after midnight the night before your surgery   Take NO these medicines the morning of surgery   As of today, STOP taking any Aspirin (unless otherwise instructed by your surgeon) Aleve, Naproxen, Ibuprofen, Motrin, Advil, Goody's, BC's, all herbal medications, fish oil, and all vitamins.                     Do not wear jewelry.            Do not wear lotions, powders, colognes, or deodorant.            Men may shave face and neck.            Do not bring valuables to the hospital.             [facility] is not responsible for any belongings or valuables.  Do NOT Smoke (Tobacco/Vaping) or drink Alcohol 24 hours prior to your procedure If you use a CPAP at night, you may bring all equipment for your overnight stay.   Contacts, glasses, dentures or bridgework may not be worn into surgery.      For patients admitted to the hospital, discharge time will be determined by your treatment team.   Patients discharged the day of surgery will not be allowed to drive home, and someone needs to stay with them for 24 hours.  Day of Surgery: Shower Wear Clean/Comfortable clothing the morning of surgery Do not apply any deodorants/lotions.   Remember to brush your teeth WITH YOUR REGULAR TOOTHPASTE.   Please read over the following fact sheets that you were given.

## 2019-11-23 ENCOUNTER — Other Ambulatory Visit (HOSPITAL_COMMUNITY)
Admission: RE | Admit: 2019-11-23 | Discharge: 2019-11-23 | Disposition: A | Discharge status: Home / Self Care | Source: Ambulatory Visit | Attending: Neurosurgery | Admitting: Neurosurgery

## 2019-11-23 DIAGNOSIS — Z20822 Contact with and (suspected) exposure to covid-19: Secondary | ICD-10-CM | POA: Insufficient documentation

## 2019-11-23 DIAGNOSIS — Z01812 Encounter for preprocedural laboratory examination: Secondary | ICD-10-CM | POA: Insufficient documentation

## 2019-11-24 LAB — SARS CORONAVIRUS 2 (TAT 6-24 HRS): SARS Coronavirus 2: NEGATIVE

## 2019-11-25 NOTE — Anesthesia Preprocedure Evaluation (Deleted)
Anesthesia Evaluation    Airway        Dental   Pulmonary Current Smoker,           Cardiovascular      Neuro/Psych    GI/Hepatic   Endo/Other    Renal/GU      Musculoskeletal   Abdominal   Peds  Hematology   Anesthesia Other Findings   Reproductive/Obstetrics                             Anesthesia Physical Anesthesia Plan  ASA:   Anesthesia Plan:    Post-op Pain Management:    Induction:   PONV Risk Score and Plan:   Airway Management Planned:   Additional Equipment:   Intra-op Plan:   Post-operative Plan:   Informed Consent:   Plan Discussed with:   Anesthesia Plan Comments: (See PAT note written 11/25/2019 by Shonna Chock, PA-C. Awaiting H&P as of 4:53 PM 11/25/19. )        Anesthesia Quick Evaluation

## 2019-11-25 NOTE — Progress Notes (Signed)
Anesthesia Chart Review: Chad Mcintyre   Case: [id number] Date/Time: 11/26/19 0945   Procedure: MRI WITH ANESTHESIA CERVICAL SPINE WITHOUT CONTRAST (N/A )   Anesthesia type: General   Pre-op diagnosis: CERVICAL DISC DISPLACEMENT C5-C6   Location: [facility] RAD[facility]OLOGY [facility] / [facility]   Surgeons: Radiologist, Medication, MD      DISCUSSION: Patient is scheduled for the above procedure. 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 11/23/19 COVID-19 test negative.   He reported cardiac evaluation (stress/echo) through the [facility] earlier this year and released from cardiac care based on results. Because he is a same day work-up, staff asked him on 11/22/19 to work on getting his cardiac records to [facility] PAT (as [facility] will not send records without a signed release). I also contacted his PCP office with the [facility] on 11/25/19 around 12:30 PM and asked to speak with a nurse or provider to inquire about results or getting records. I was told staff would have to contact me once finished seeing patients. No phone call by 4:40 PM on 11/25/19, but learned office apparently closes at 4:30 PM.   Staff at Dr. Rush Farmer' office was also contacted on 11/22/19 and 11/25/19 about needing an updated H&P. I last spoke with Sophia who reported that they are still working on authorization to get the study done, so it is possible case will have to be rescheduled. I notified her that if not rescheduled, he would need a H&P within the past 30 days and that I had not yet received any records from the [facility] (although attempted to get through patient and/or PCP with the [facility]).   VS: Ht 6\' 1"  (1.854 m)   Wt 83.9 kg   BMI 24.41 kg/m    PROVIDERS: PCP , NP [place], [place]). [facility] phone:  [phone].  Cardiologist - per patient recently released from services [phone], Kathryne Sharper)  LABS: Day of procedure as indicated.  CV: See DISCUSSION.   Past Medical History:  Diagnosis Date  . Acid reflux   . Dyspnea    per patient, since second COVID second vaccination  . History of hiatal hernia   . Paresthesia 11/18/2019  . Sleep apnea    per patient does not wear CPAP     Past Surgical History:  Procedure Laterality Date  . ANEURYSM COILING     per patient few years ago  . CHOLECYSTECTOMY    . HERNIA REPAIR    . HIATAL HERNIA REPAIR    . LAMINECTOMY AND MICRODISCECTOMY SPINE     X2  . TONSILLECTOMY      MEDICATIONS: No current facility-administered medications for this encounter.   11/20/2019 acetaminophen-codeine (TYLENOL #3) 300-30 MG tablet  . aspirin 81 MG chewable tablet  . Tiotropium Bromide-Olodaterol (STIOLTO RESPIMAT) 2.5-2.5 MCG/ACT AERS    Marland Kitchen, PA-C Surgical Short Stay/Anesthesiology [facility] Phone [phone] [facility] Phone [phone] 11/25/2019 4:53 PM

## 2019-11-26 ENCOUNTER — Ambulatory Visit (HOSPITAL_COMMUNITY): Admission: RE | Admit: 2019-11-26 | Source: Ambulatory Visit

## 2019-11-26 ENCOUNTER — Ambulatory Visit (HOSPITAL_COMMUNITY): Admission: RE | Admit: 2019-11-26 | Source: Home / Self Care | Admitting: Neurosurgery

## 2019-11-26 ENCOUNTER — Encounter (HOSPITAL_COMMUNITY): Payer: Self-pay

## 2019-11-26 HISTORY — DX: Personal history of other diseases of the digestive system: Z87.19

## 2019-11-26 HISTORY — DX: Gastro-esophageal reflux disease without esophagitis: K21.9

## 2019-11-26 HISTORY — DX: Dyspnea, unspecified: R06.00

## 2019-11-26 HISTORY — DX: Sleep apnea, unspecified: G47.30

## 2019-11-26 SURGERY — MRI WITH ANESTHESIA
Anesthesia: General

## 2019-12-11 ENCOUNTER — Other Ambulatory Visit (HOSPITAL_COMMUNITY): Payer: Self-pay | Admitting: Neurosurgery

## 2019-12-11 ENCOUNTER — Other Ambulatory Visit: Payer: Self-pay | Admitting: Neurosurgery

## 2019-12-11 DIAGNOSIS — M542 Cervicalgia: Secondary | ICD-10-CM

## 2019-12-15 ENCOUNTER — Other Ambulatory Visit: Payer: Self-pay

## 2019-12-15 ENCOUNTER — Encounter (HOSPITAL_COMMUNITY): Payer: Self-pay | Admitting: Emergency Medicine

## 2019-12-15 ENCOUNTER — Emergency Department (HOSPITAL_COMMUNITY)
Admission: EM | Admit: 2019-12-15 | Discharge: 2019-12-16 | Disposition: A | Discharge status: Home / Self Care | Attending: Emergency Medicine | Admitting: Emergency Medicine

## 2019-12-15 DIAGNOSIS — M546 Pain in thoracic spine: Secondary | ICD-10-CM

## 2019-12-15 DIAGNOSIS — Z7982 Long term (current) use of aspirin: Secondary | ICD-10-CM | POA: Diagnosis not present

## 2019-12-15 DIAGNOSIS — M545 Low back pain, unspecified: Secondary | ICD-10-CM | POA: Insufficient documentation

## 2019-12-15 DIAGNOSIS — F17210 Nicotine dependence, cigarettes, uncomplicated: Secondary | ICD-10-CM | POA: Insufficient documentation

## 2019-12-15 DIAGNOSIS — M5414 Radiculopathy, thoracic region: Secondary | ICD-10-CM

## 2019-12-15 NOTE — ED Triage Notes (Addendum)
Pt to ED with c/o severe mid to lower back pain.  Pt sts he has had back problems for a while.  Pain worse over past 2 days   Pt denies injury  Pt sts he has taken his pain meds and muscle relaxer today without relief

## 2019-12-16 ENCOUNTER — Other Ambulatory Visit: Payer: Self-pay

## 2019-12-16 MED ORDER — DIAZEPAM 5 MG PO TABS: 5.0000 mg | ORAL_TABLET | Freq: Three times a day (TID) | ORAL | 0 refills | Status: DC | PRN

## 2019-12-16 MED ORDER — ACETAMINOPHEN-CODEINE #3 300-30 MG PO TABS
1.0000 | ORAL_TABLET | Freq: Four times a day (QID) | ORAL | 0 refills | Status: AC | PRN
Start: 2019-12-16 — End: ?

## 2019-12-16 MED ORDER — PREDNISONE 10 MG (21) PO TBPK: ORAL_TABLET | ORAL | 0 refills | Status: DC

## 2019-12-16 MED ORDER — MORPHINE SULFATE (PF) 4 MG/ML IV SOLN
4.0000 mg | Freq: Once | INTRAVENOUS | Status: AC
  Administered 2019-12-16: 4 mg via INTRAVENOUS
  Filled 2019-12-16: qty 1

## 2019-12-16 MED ORDER — KETOROLAC TROMETHAMINE 30 MG/ML IJ SOLN
30.0000 mg | Freq: Once | INTRAMUSCULAR | Status: AC
  Administered 2019-12-16: 30 mg via INTRAVENOUS
  Filled 2019-12-16: qty 1

## 2019-12-16 MED ORDER — ONDANSETRON HCL 4 MG/2ML IJ SOLN
4.0000 mg | Freq: Once | INTRAMUSCULAR | Status: AC
  Administered 2019-12-16: 4 mg via INTRAVENOUS
  Filled 2019-12-16: qty 2

## 2019-12-16 MED ORDER — DEXAMETHASONE SODIUM PHOSPHATE 10 MG/ML IJ SOLN
10.0000 mg | Freq: Once | INTRAMUSCULAR | Status: AC
  Administered 2019-12-16: 10 mg via INTRAVENOUS
  Filled 2019-12-16: qty 1

## 2019-12-16 MED ORDER — IBUPROFEN 800 MG PO TABS: 800.0000 mg | ORAL_TABLET | Freq: Three times a day (TID) | ORAL | 0 refills | Status: DC | PRN

## 2019-12-16 MED ORDER — HYDROMORPHONE HCL 1 MG/ML IJ SOLN
1.0000 mg | Freq: Once | INTRAMUSCULAR | Status: AC
  Administered 2019-12-16: 1 mg via INTRAVENOUS
  Filled 2019-12-16: qty 1

## 2019-12-16 MED ORDER — DIAZEPAM 5 MG/ML IJ SOLN
5.0000 mg | Freq: Once | INTRAMUSCULAR | Status: AC
  Administered 2019-12-16: 5 mg via INTRAVENOUS
  Filled 2019-12-16: qty 2

## 2019-12-16 NOTE — ED Provider Notes (Signed)
TIME SEEN: 1:31 AM  CHIEF COMPLAINT: Back pain  HPI: Patient is a 54 year old male with history of previous cervical spine surgery, lumbar spine surgery who presents to the emergency department with midthoracic back pain for the past several days.  He has been taking Tylenol 3, Robaxin and ibuprofen without relief.  States he feels weakness in his right lower extremity for the past day.  No numbness, tingling, bowel or bladder incontinence, fever.  His neurosurgeon is Dr. Venetia Maxon.  He denies any known injury, increased physical exertion, fall.  He has had ACDF C5-C6 in January 2020.  He has had discectomy and hemilaminectomy L5-S1 x 2 in 1997 and 1998.  ROS: See HPI Constitutional: no fever  Eyes: no drainage  ENT: no runny nose   Cardiovascular:  no chest pain  Resp: no SOB  GI: no vomiting GU: no dysuria Integumentary: no rash  Allergy: no hives  Musculoskeletal: no leg swelling  Neurological: no slurred speech ROS otherwise negative  PAST MEDICAL HISTORY/PAST SURGICAL HISTORY:  Past Medical History:  Diagnosis Date  . Acid reflux   . Dyspnea    per patient, since second COVID second vaccination  . History of hiatal hernia   . Paresthesia 11/18/2019  . Sleep apnea    per patient does not wear CPAP     MEDICATIONS:  Prior to Admission medications   Medication Sig Start Date End Date Taking? Authorizing Provider  acetaminophen-codeine (TYLENOL #3) 300-30 MG tablet Take 1 tablet by mouth every 8 (eight) hours as needed for moderate pain.    [provider]  aspirin 81 MG chewable tablet Chew 81 mg by mouth daily.    [provider]  Tiotropium Bromide-Olodaterol (STIOLTO RESPIMAT) 2.5-2.5 MCG/ACT AERS Inhale 2 puffs into the lungs daily.    [provider]    ALLERGIES:  Allergies  Allergen Reactions  . Oxycodone-Acetaminophen Nausea And Vomiting  . Valproic Acid Other (See Comments)    aggression     SOCIAL HISTORY:  Social History    Tobacco Use  . Smoking status: Current Every Day Smoker    Types: Cigarettes    Last attempt to quit: 02/14/2019    Years since quitting: 0.8  . Smokeless tobacco: Former Neurosurgeon  . Tobacco comment: per patient about three-quarters packs/day  Substance Use Topics  . Alcohol use: Yes    Comment: occasioanally    FAMILY HISTORY: No family history on file.  EXAM: BP 120/84   Pulse 92   Temp 98.2 F (36.8 C) (Oral)   Resp 15   Ht 6\' 1"  (1.854 m)   Wt 83.9 kg   SpO2 98%   BMI 24.41 kg/m  CONSTITUTIONAL: Alert and oriented and responds appropriately to questions.  Appears uncomfortable with any movement HEAD: Normocephalic EYES: Conjunctivae clear, pupils appear equal, EOM appear intact ENT: normal nose; moist mucous membranes NECK: Supple, normal ROM CARD: RRR; S1 and S2 appreciated; no murmurs, no clicks, no rubs, no gallops RESP: Normal chest excursion without splinting or tachypnea; breath sounds clear and equal bilaterally; no wheezes, no rhonchi, no rales, no hypoxia or respiratory distress, speaking full sentences ABD/GI: Normal bowel sounds; non-distended; soft, non-tender, no rebound, no guarding, no peritoneal signs, no hepatosplenomegaly BACK:  The back appears normal, tender to palpation over the mid and lower thoracic spine without step-off, deformity.  No redness, warmth.  No rash, ecchymosis or other lesions. EXT: Normal ROM in all joints; no deformity noted, no edema; no cyanosis SKIN: Normal color  for age and race; warm; no rash on exposed skin NEURO: Moves all extremities equally, normal sensation in all extremities, patient has some decreased strength to hip flexion in the right lower extremity but otherwise strength intact and equal to the left lower extremity, no hyperreflexia or clonus, no saddle anesthesia PSYCH: The patient's mood and manner are appropriate.   MEDICAL DECISION MAKING: Patient here with mid back pain.  Suspect that he has some component of  radiculopathy.  Doubt fracture, cauda equina, epidural abscess or hematoma, discitis or osteomyelitis, transverse myelitis.  I do not feel he needs an emergent MRI at this time and he is comfortable with this plan.  Will treat pain with morphine, Toradol, Decadron and reassess.  ED PROGRESS: No significant improvement after morphine.  Will give Dilaudid and reassess.   7:35 AM  Pt reports feeling better and able to ambulate without difficulty.  Feels better after Valium.  Will refill his Tylenol 3 and also discharged with Valium, ibuprofen and prednisone taper.  He will follow-up with his PCP as well as his neurosurgeon.  Discussed return precautions.  Wife here to drive him home.   At this time, I do not feel there is any life-threatening condition present. I have reviewed, interpreted and discussed all results (EKG, imaging, lab, urine as appropriate) and exam findings with patient/family. I have reviewed nursing notes and appropriate previous records.  I feel the patient is safe to be discharged home without further emergent workup and can continue workup as an outpatient as needed. Discussed usual and customary return precautions. Patient/family verbalize understanding and are comfortable with this plan.  Outpatient follow-up has been provided as needed. All questions have been answered.      AVON MERGENTHALER was evaluated in Emergency Department on 12/16/2019 for the symptoms described in the history of present illness. He was evaluated in the context of the global COVID-19 pandemic, which necessitated consideration that the patient might be at risk for infection with the SARS-CoV-2 virus that causes COVID-19. Institutional protocols and algorithms that pertain to the evaluation of patients at risk for COVID-19 are in a state of rapid change based on information released by regulatory bodies including the CDC and federal and state organizations. These policies and algorithms were followed during the  patient's care in the ED.      Trisha Morandi, Layla Maw, DO 12/16/19 [phone]

## 2019-12-16 NOTE — ED Notes (Signed)
Walked patient around nursing station, was able to walk with problem, or increase of pain.

## 2019-12-18 ENCOUNTER — Ambulatory Visit (HOSPITAL_COMMUNITY)
Admission: RE | Admit: 2019-12-18 | Discharge: 2019-12-18 | Disposition: A | Discharge status: Home / Self Care | Payer: No Typology Code available for payment source | Source: Ambulatory Visit | Attending: Neurosurgery | Admitting: Neurosurgery

## 2019-12-18 ENCOUNTER — Other Ambulatory Visit: Payer: Self-pay

## 2019-12-18 DIAGNOSIS — M542 Cervicalgia: Secondary | ICD-10-CM | POA: Diagnosis not present

## 2019-12-18 IMAGING — CT CT CERVICAL SPINE W/O CM
4 series · 14 of 33 positions shown, 17 images · non-contrast
Comparison: Radiography [DATE].  Neck CT [DATE].

CLINICAL DATA: Persistent neck pain with numbness in the left arm
over the last 9 months.

EXAM:
CT CERVICAL SPINE WITHOUT CONTRAST
TECHNIQUE: Multidetector CT imaging of the cervical spine was performed without
intravenous contrast. Multiplanar CT image reconstructions were also
generated.

[Series 4: c spine soft · axial · 0.39mm/px · 1 of 103 slices shown]
[im 18/103  soft-tissue]
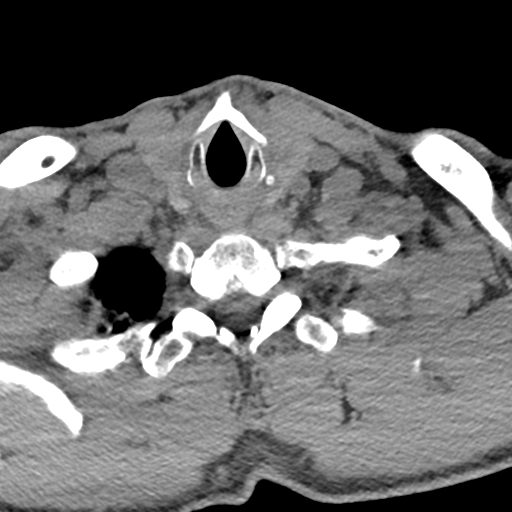

[Series 5: sag bone · sagittal · 0.37mm/px · 5 of 96 slices shown, 6 images]
[im 32/96  bone]
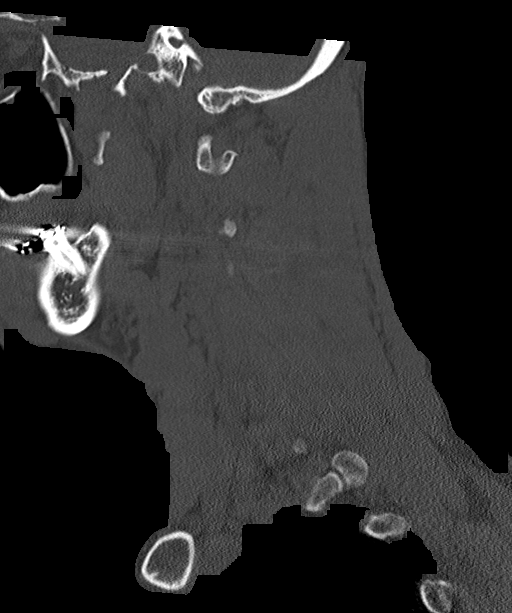
[im 40/96  bone]
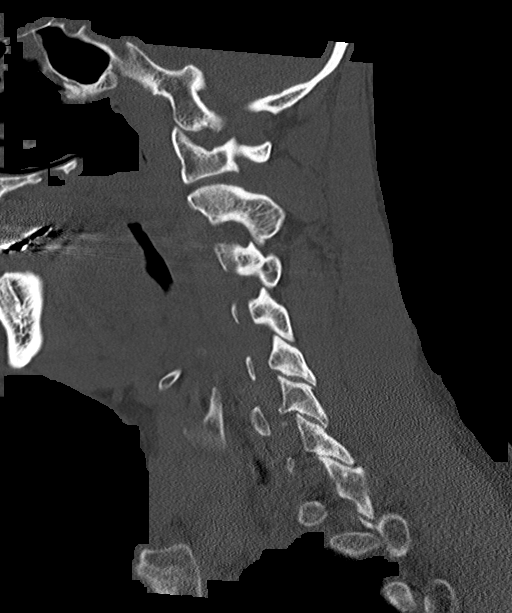
[im 48/96  soft-tissue]
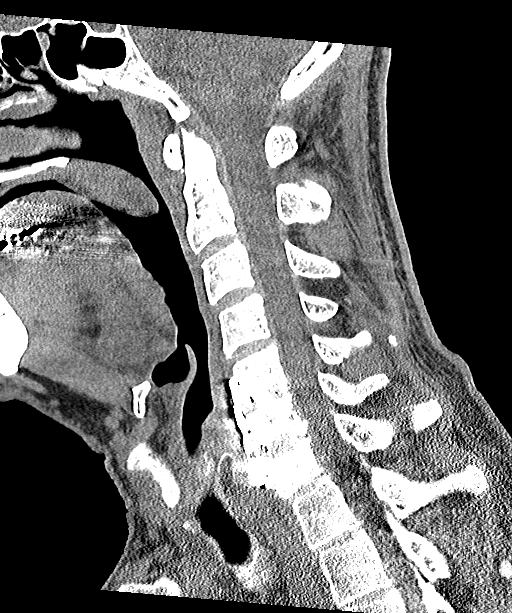
[im 48/96  bone]
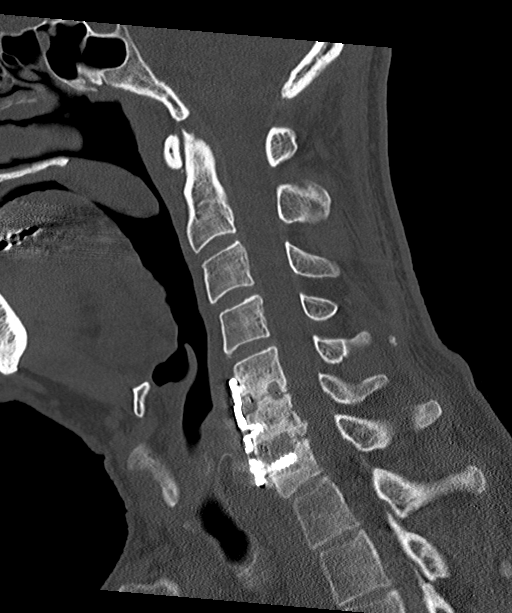
[im 56/96  bone]
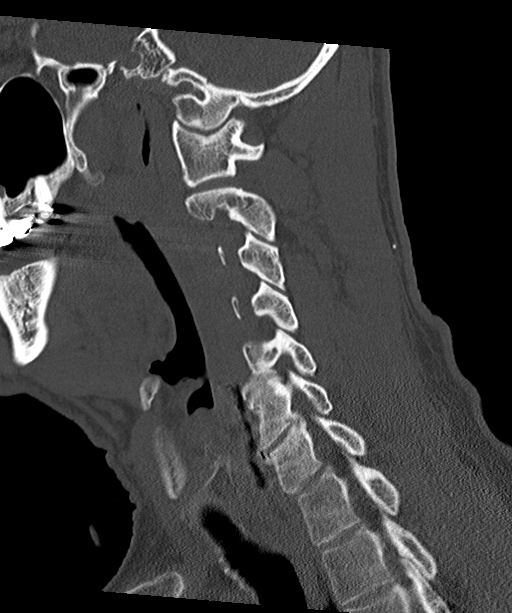
[im 64/96  bone]
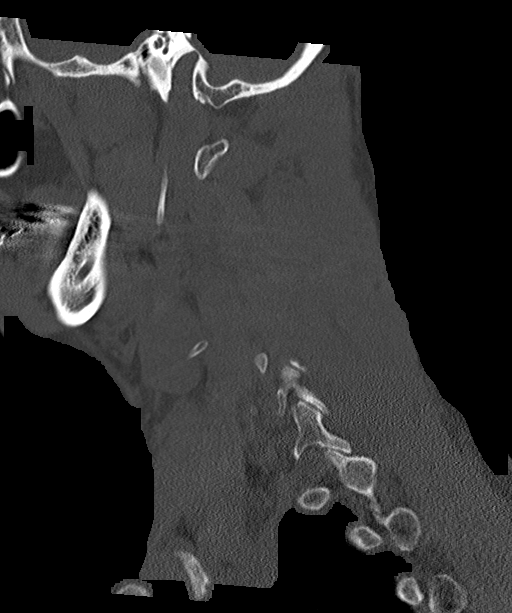

[Series 6: cor bone · coronal · 0.40mm/px · 3 of 74 slices shown]
[im 22/74  bone]
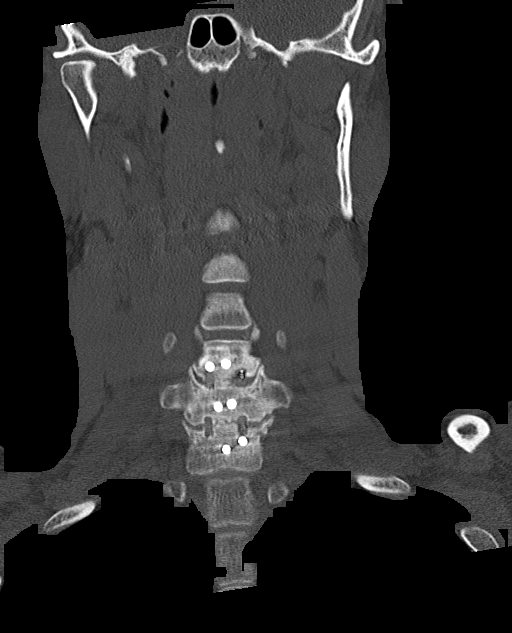
[im 32/74  bone]
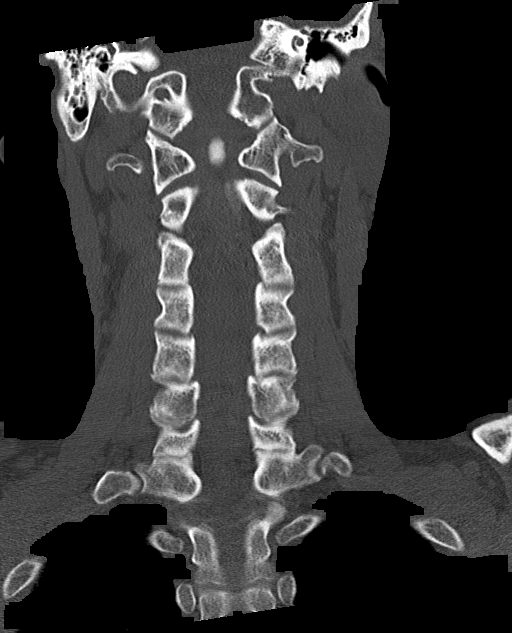
[im 42/74  bone]
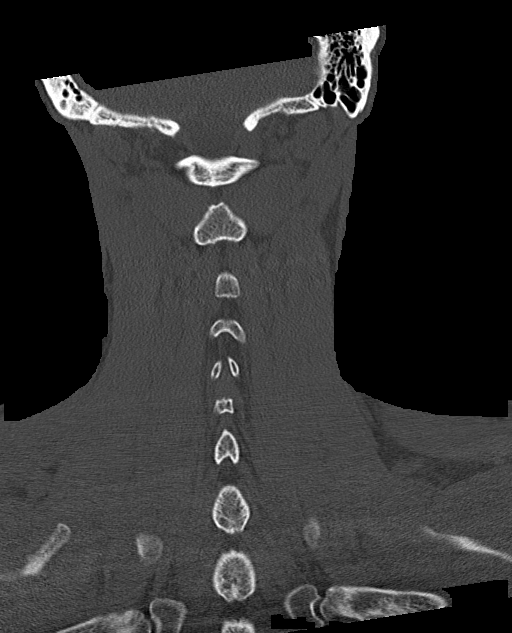

[Series 7: orthogonal axials · axial · 0.21mm/px · z∈[+1400,+1529]mm · 5 of 101 slices shown, 7 images]
[im 17/101  soft-tissue]
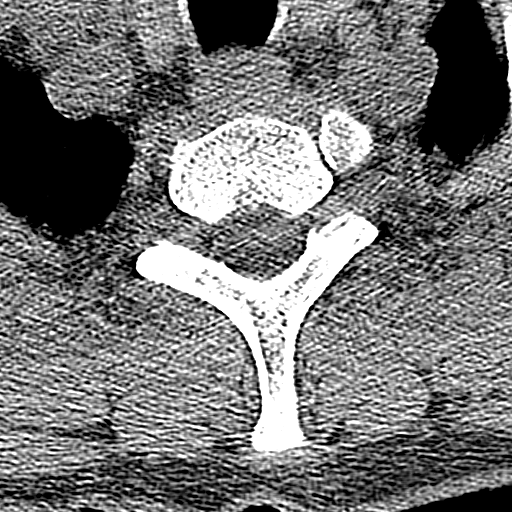
[im 17/101  bone]
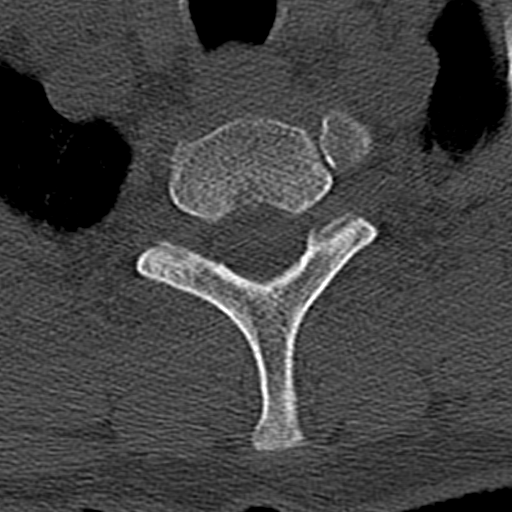
[im 34/101  bone]
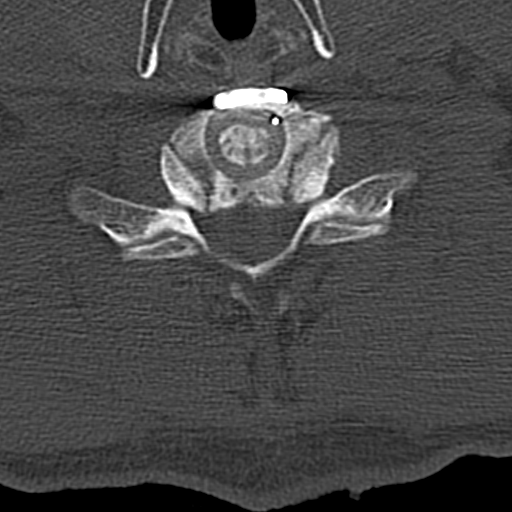
[im 51/101  bone]
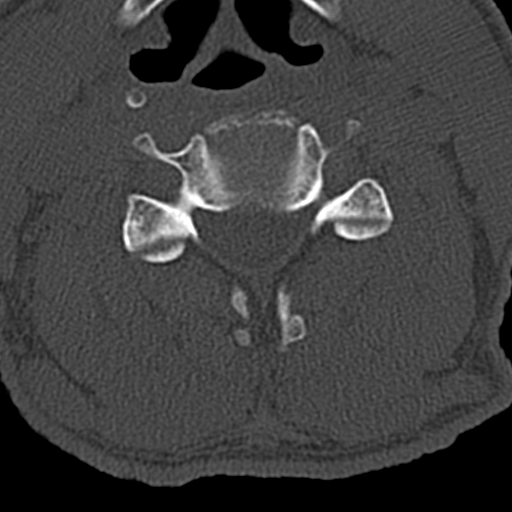
[im 67/101  bone]
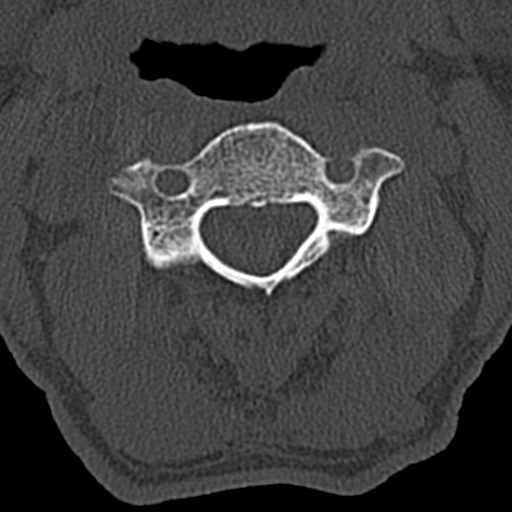
[im 84/101  soft-tissue]
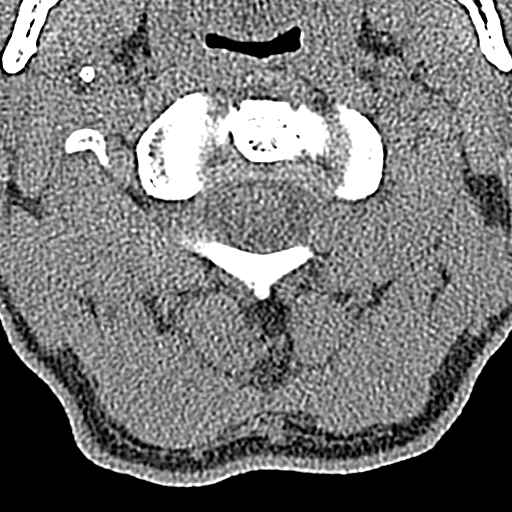
[im 84/101  bone]
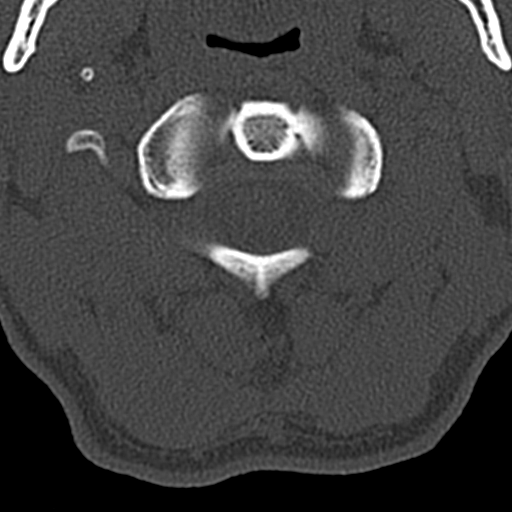

[14 of 33 positions shown; findings below may reference images not displayed]

FINDINGS: Alignment: Normal

Skull base and vertebrae: Previous ACDF C5-C7 as a good appearance
with solid union and no evidence hardware loosening or malposition.

Soft tissues and spinal canal: No neck soft tissue lesion.

Disc levels: Foramen magnum widely patent. C1-2 through C4-5 are
normal. No degenerative finding. No stenosis of the canal or
foramina.

C5 through C7: Previous ACDF appears solid. Wide patency of the
canal. Chronic bony foraminal narrowing at C5-6 and C6-7, more
pronounced at C6-7 than C5-6. Some potential that either C7 nerve
could be affected as it exits the narrowed foramen.

C7-T1: Normal interspace.  No canal or foraminal stenosis.

Upper chest: Normal

Other: None
IMPRESSION: Previous ACDF C5-C7 has a good appearance with solid union and wide
patency of the canal. Chronic bony foraminal narrowing at C5-6 and
C6-7, more pronounced at C6-7 than C5-6. Some potential that either
C7 nerve could be affected as it exits the narrowed foramen.

## 2019-12-23 ENCOUNTER — Encounter (HOSPITAL_COMMUNITY): Payer: Self-pay | Admitting: Vascular Surgery

## 2019-12-23 NOTE — Progress Notes (Signed)
Anesthesia Chart Review: Chad Mcintyre   Case: [id number] Date/Time: 12/31/19 0745   Procedure: MRI WITH ANESTHESIA OF CERVICAL SPINE WITHOUT CONTRAST (N/A )   Anesthesia type: General   Pre-op diagnosis: OTHER CERVICAL DISK DISPLACEMENT OF C5-C6 LEVEL   Location: [facility] / [facility]   Surgeons: Radiologist, Medication, MD      DISCUSSION: Patient is scheduled for the above procedure. MRI under anesthesia was initially scheduled for 11/26/19, but was delayed to allow time for authorization from [facility]. MRI was ordered by Maeola Harman, MD. H&P signed on 12/04/19.   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 ED visit for mid-thoracic back pain 12/16/19.   He was evaluated by cardiology at [facility] in March 2021 for dyspnea and chest tightness. Similar symptoms in 2016 when he was evaluated for syncope and was diagnosed with cerebral aneurysm (s/p embolization 2017). At that time, he had a normal stress test (03/10/14) and tilt table test (03/06/14), normal carotid US (09/12/14), an event monitor (03/15/14-04/14/14) that showed SR/ST with occasional PVCs , and an unremarkable TEE other than finding of PFO ("interatrial shunt" by injection) on 09/19/14 in [place], [place] (see [facility] Everywhere). By notes, more recent work-up included PFTs (in 11/2018 in [place]) "which showed mild obstructive lung disease without reversibility" and ongoing pulmonology evaluation for OSA with CPAP non-compliance due to "setting are too strong and cause him to have poor sleep" with plan to try to titrate CPAP setting or changed to BiPAP. An echo and stress echo were done that were reassuring (see below). He reported that he was released from cardiac care based on the results.   Pre-procedure COVID-19  test is scheduled for 12/28/19. Anesthesia team to evaluate on the day of procedure.     VS:  Wt Readings from Last 3 Encounters:  12/15/19 83.9 kg  02/17/19 80.2 kg   BP Readings from Last 3 Encounters:  12/16/19 108/76  02/18/19 117/71   Pulse Readings from Last 3 Encounters:  12/16/19 78  02/18/19 90    PROVIDERS: [name], [name], MD is listed as PCP [facility]). He is followed at the [facility], [phone]. - He was evaluated by cardiology at [facility] by Ardith Dark, MD/Miller, Liliana Cline, MD in March 2021 for dyspnea and chest tightness with reassuring stress echo and echo.    LABS: Labs as indicated on the day of procedure. On 02/17/19, Cr 0.95, H/H 17.6/50.3, PLT 189.    IMAGES: CT C-spine 12/18/19: IMPRESSION: Previous ACDF C5-C7 has a good appearance with solid union and wide patency of the canal. Chronic bony foraminal narrowing at C5-6 and C6-7, more pronounced at C6-7 than C5-6. Some potential that either C7 nerve could be affected as it exits the narrowed foramen.   EKG: 06/28/19 [facility]): Normal sinus rhythm.  Minimal voltage criteria for LVH, may be normal variant.  Borderline ECG.   CV: Stress Echo 05/27/19 [facility]): Findings: The patient achieved 87% of maximum predicted heart rate.  The maximal heart rate achieved was 144 bpm.  The maximum blood pressure achieved was 160/98 mmHg.  The patient exercised for 8:01 protocol.  METS achieved was 13.40.  The patient exhibited fatigue during exercise.  The patient has normal functional capacity. There was normal augmentation of all left ventricular wall segments post exercise. Summary: Negative exercise stress  echocardiogram and adequate heart rate.   Echo 04/12/19 ([facility]): Summary: The left ventricle is normal in size. There is mild concentric left ventricular hypertrophy. Left ventricular systolic function is normal. Ejection fraction is 50 to 55%  (visual estimation). The right ventricle is normal in size and function. The tricuspid valve is grossly normal. There is trace tricuspid regurgitation. Right atrial size is normal. There is mild mitral valve thickening. The left atrial size is normal. The pulmonic valve is not well-visualized. The aortic valve leaflets are not well visualized. The aortic root is normal size. There is no pericardial effusion.   TEE 09/19/14 ([facility]): Conclusion  The left ventricle is normal in size.Ejection Fraction = 55-60%. The left atrial size is normal. Right atrial size is normal. The right ventricle is normal size. Structurally normal aortic valve.The mitral valve leaflets appear normal. There is no evidence of stenosis, fluttering, or prolapse. There is trace mitral regurgitation. Structurally normal tricuspid valve. Structurally normal pulmonic valve. The aortic root is normal size. The IVC is normal in size with an inspiratory collapse of greater then 50%, suggesting normal right atrial pressure.  There is no pericardial effusion. Injection of contrast documented an interatrial shunt.    Past Medical History:  Diagnosis Date  . Acid reflux   . Dyspnea    per patient, since second COVID second vaccination  . History of hiatal hernia   . 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 Paresthesia 11/18/2019  . Sleep apnea    per patient does not wear CPAP     Past Surgical History:  Procedure Laterality Date  . ANEURYSM COILING     per patient few years ago  . BACK SURGERY    . CHOLECYSTECTOMY    . HERNIA REPAIR    . HIATAL HERNIA REPAIR    . LAMINECTOMY AND MICRODISCECTOMY SPINE     X2  . TONSILLECTOMY      MEDICATIONS: No current facility-administered medications for this encounter.   Marland Kitchen acetaminophen-codeine (TYLENOL #3) 300-30 MG tablet  . acetaminophen-codeine (TYLENOL #3) 300-30  MG tablet  . aspirin 81 MG chewable tablet  . diazepam (VALIUM) 5 MG tablet  . ibuprofen (ADVIL) 800 MG tablet  . predniSONE (STERAPRED UNI-PAK 21 TAB) 10 MG (21) TBPK tablet  . Tiotropium Bromide-Olodaterol (STIOLTO RESPIMAT) 2.5-2.5 MCG/ACT AERS    Shonna Chock, PA-C Surgical Short Stay/Anesthesiology [facility] Phone [phone] [facility] Phone [phone] 12/23/2019 3:28 PM

## 2019-12-23 NOTE — Anesthesia Preprocedure Evaluation (Addendum)
Anesthesia Evaluation  Patient identified by MRN, date of birth, ID band Patient awake    Reviewed: Allergy & Precautions, NPO status , Patient's Chart, lab work & pertinent test results  Airway Mallampati: II  TM Distance: >3 FB Neck ROM: Full    Dental  (+) Missing, Dental Advisory Given,    Pulmonary sleep apnea (does not use CPAP) , Current Smoker and Patient abstained from smoking.,    Pulmonary exam normal breath sounds clear to auscultation       Cardiovascular Normal cardiovascular exam Rhythm:Regular Rate:Normal  Stress Echo 05/27/19 [facility]): Findings: The patient achieved 87% of maximum predicted heart rate.  The maximal heart rate achieved was 144 bpm.  The maximum blood pressure achieved was 160/98 mmHg.  The patient exercised for 8:01 protocol.  METS achieved was 13.40.  The patient exhibited fatigue during exercise.  The patient has normal functional capacity. There was normal augmentation of all left ventricular wall segments post exercise. Summary: Negative exercise stress echocardiogram and adequate heart rate.   Echo 04/12/19 ([facility]): Summary: The left ventricle is normal in size. There is mild concentric left ventricular hypertrophy. Left ventricular systolic function is normal. Ejection fraction is 50 to 55% (visual estimation). The right ventricle is normal in size and function. The tricuspid valve is grossly normal. There is trace tricuspid regurgitation. Right atrial size is normal. There is mild mitral valve thickening. The left atrial size is normal. The pulmonic valve is not well-visualized. The aortic valve leaflets are not well visualized. The aortic root is normal size. There is no pericardial effusion.    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)    GI/Hepatic hiatal hernia, GERD  Controlled,  Endo/Other    Renal/GU      Musculoskeletal C5-6  ACDF 02/13/18   Abdominal   Peds  Hematology   Anesthesia Other Findings   Reproductive/Obstetrics                          Anesthesia Physical Anesthesia Plan  ASA: II  Anesthesia Plan: MAC   Post-op Pain Management:    Induction: Intravenous  PONV Risk Score and Plan: 0 and Midazolam  Airway Management Planned: Natural Airway and Simple Face Mask  Additional Equipment:   Intra-op Plan:   Post-operative Plan:   Informed Consent: I have reviewed the patients History and Physical, chart, labs and discussed the procedure including the risks, benefits and alternatives for the proposed anesthesia with the patient or authorized representative who has indicated his/her understanding and acceptance.     Dental advisory given  Plan Discussed with: CRNA  Anesthesia Plan Comments: (   )     Anesthesia Quick Evaluation

## 2019-12-27 ENCOUNTER — Encounter (HOSPITAL_COMMUNITY): Payer: Self-pay

## 2019-12-27 NOTE — Progress Notes (Signed)
PCP:  Patient sees the [place] in [place] Cardiologist:  Denies  EKG:  N/A CXR:  N/a ECHO:  Denies Stress Test:  Denies Cardiac Cath:  Denies   Fasting Blood Sugar-  N/A Checks Blood Sugar_N/A__ times a day  OSA:  Yes CPAP:  Does not wear  ASA/Blood Thinners:  NO  Covid test 12/28/19  Anesthesia Review:  No  Patient denies shortness of breath, fever, cough, and chest pain at PAT appointment.  Patient verbalized understanding of instructions provided today at the PAT appointment.  Patient asked to review instructions at home and day of surgery.

## 2019-12-28 ENCOUNTER — Other Ambulatory Visit (HOSPITAL_COMMUNITY)
Admission: RE | Admit: 2019-12-28 | Discharge: 2019-12-28 | Disposition: A | Discharge status: Home / Self Care | Source: Ambulatory Visit | Attending: Neurosurgery | Admitting: Neurosurgery

## 2019-12-28 DIAGNOSIS — Z01812 Encounter for preprocedural laboratory examination: Secondary | ICD-10-CM | POA: Insufficient documentation

## 2019-12-28 DIAGNOSIS — Z20822 Contact with and (suspected) exposure to covid-19: Secondary | ICD-10-CM | POA: Insufficient documentation

## 2019-12-28 LAB — SARS CORONAVIRUS 2 (TAT 6-24 HRS): SARS Coronavirus 2: NEGATIVE

## 2019-12-31 ENCOUNTER — Other Ambulatory Visit: Payer: Self-pay

## 2019-12-31 ENCOUNTER — Ambulatory Visit (HOSPITAL_COMMUNITY)
Admission: RE | Admit: 2019-12-31 | Discharge: 2019-12-31 | Disposition: A | Discharge status: Home / Self Care | Attending: Neurosurgery | Admitting: Neurosurgery

## 2019-12-31 ENCOUNTER — Ambulatory Visit (HOSPITAL_COMMUNITY)
Admission: RE | Admit: 2019-12-31 | Discharge: 2019-12-31 | Disposition: A | Discharge status: Home / Self Care | Source: Ambulatory Visit | Attending: Neurosurgery | Admitting: Neurosurgery

## 2019-12-31 ENCOUNTER — Encounter (HOSPITAL_COMMUNITY): Payer: Self-pay

## 2019-12-31 ENCOUNTER — Encounter (HOSPITAL_COMMUNITY): Admission: RE | Disposition: A | Discharge status: Home / Self Care | Payer: Self-pay | Source: Home / Self Care

## 2019-12-31 ENCOUNTER — Ambulatory Visit (HOSPITAL_COMMUNITY): Admitting: Vascular Surgery

## 2019-12-31 DIAGNOSIS — M47812 Spondylosis without myelopathy or radiculopathy, cervical region: Secondary | ICD-10-CM | POA: Insufficient documentation

## 2019-12-31 DIAGNOSIS — Z981 Arthrodesis status: Secondary | ICD-10-CM | POA: Insufficient documentation

## 2019-12-31 DIAGNOSIS — M50222 Other cervical disc displacement at C5-C6 level: Secondary | ICD-10-CM | POA: Insufficient documentation

## 2019-12-31 DIAGNOSIS — M542 Cervicalgia: Secondary | ICD-10-CM | POA: Insufficient documentation

## 2019-12-31 HISTORY — PX: RADIOLOGY WITH ANESTHESIA: SHX6223

## 2019-12-31 IMAGING — MR MR CERVICAL SPINE W/O CM
5 series · 37 of 48 positions shown · non-contrast
Comparison: MRI of the cervical spine [DATE]; CT of the
cervical spine [DATE].

CLINICAL DATA: Neck pain.

EXAM:
MRI CERVICAL SPINE WITHOUT CONTRAST
TECHNIQUE: Multiplanar, multisequence MR imaging of the cervical spine was
performed. No intravenous contrast was administered.

[Series 5: T2 · sagittal · 3.0mm · 0.72mm/px · 6 of 15 slices shown (1 of 2)]
[im 1/15]
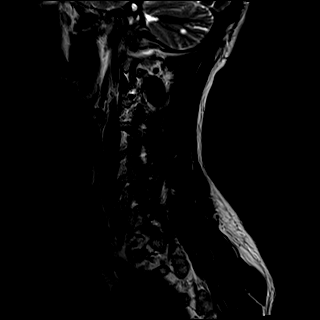
[im 3/15]
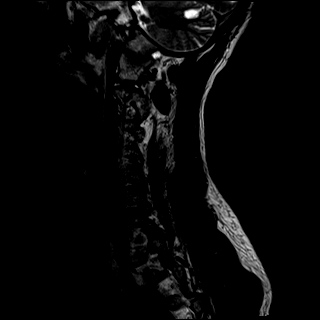
[im 6/15]
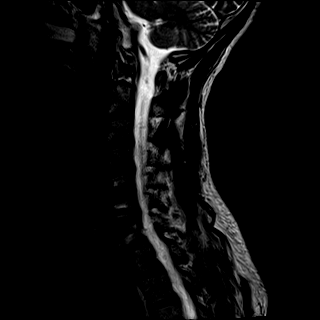
[im 9/15]
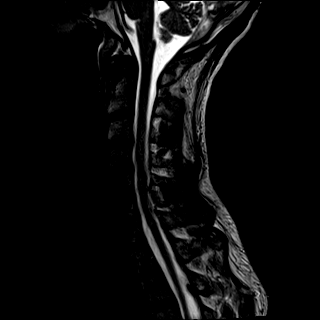
[im 12/15]
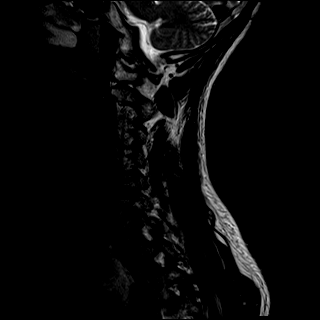
[im 15/15]
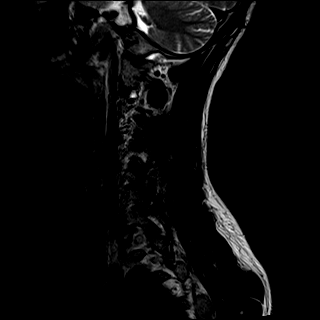

[Series 6: T1 · sagittal · 3.0mm · 0.72mm/px · 6 of 15 slices shown]
[im 1/15]
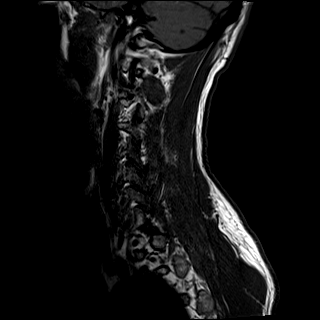
[im 3/15]
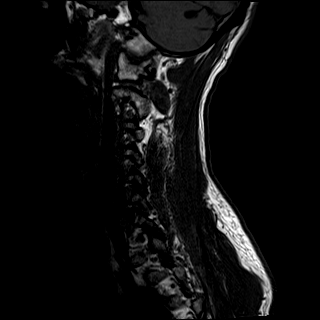
[im 6/15]
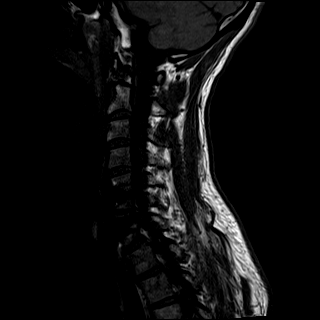
[im 9/15]
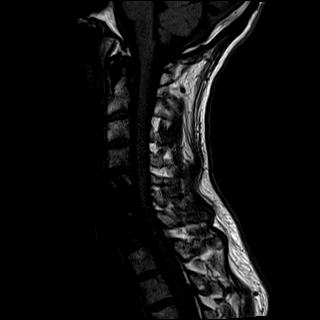
[im 12/15]
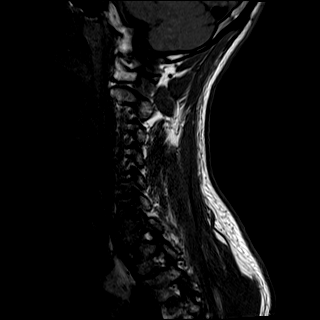
[im 15/15]
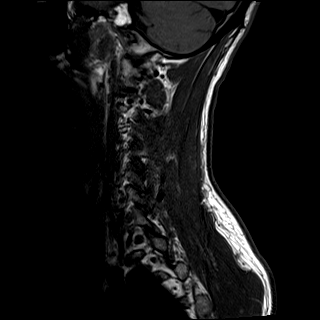

[Series 7: STIR · sagittal · 3.0mm · 0.90mm/px · 6 of 15 slices shown]
[im 1/15]
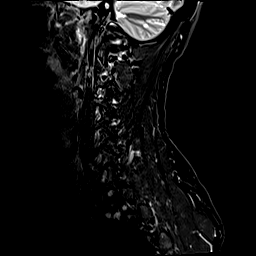
[im 3/15]
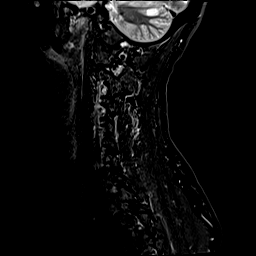
[im 6/15]
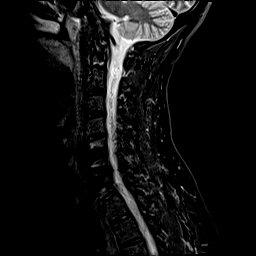
[im 9/15]
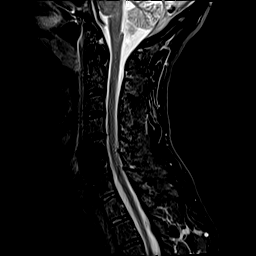
[im 12/15]
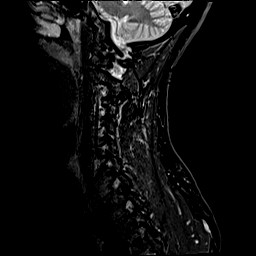
[im 15/15]
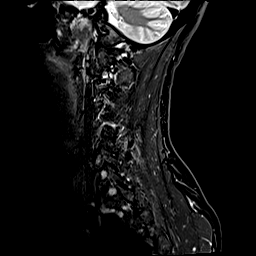

[Series 8: T2 · axial · 3.0mm · 0.66mm/px · z∈[-128,-4]mm · 11 of 40 slices shown (2 of 2)]
[im 1/40]
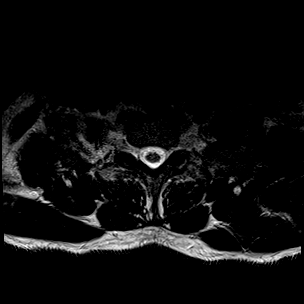
[im 3/40]
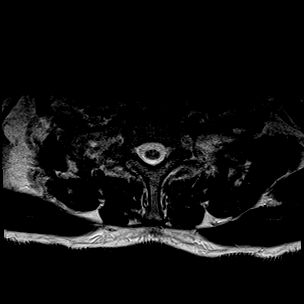
[im 6/40]
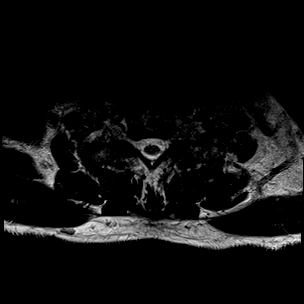
[im 9/40]
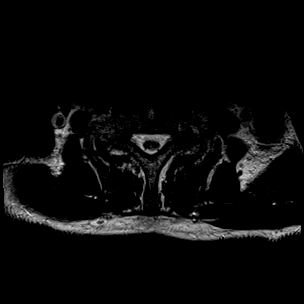
[im 12/40]
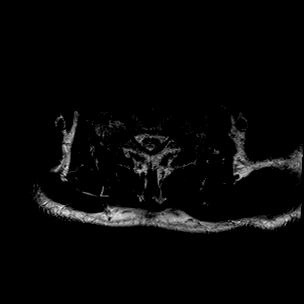
[im 17/40]
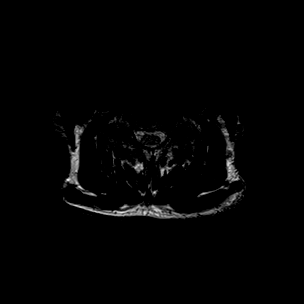
[im 20/40]
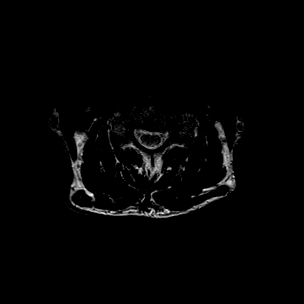
[im 23/40]
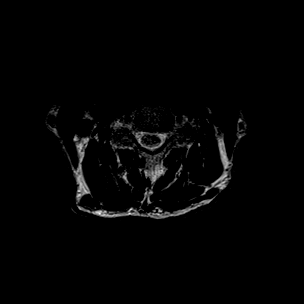
[im 28/40]
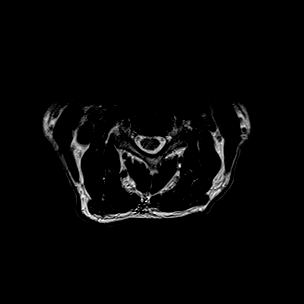
[im 34/40]
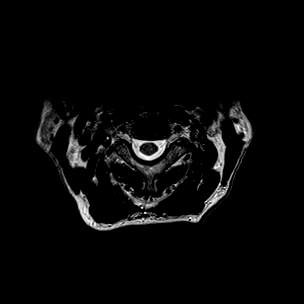
[im 40/40]
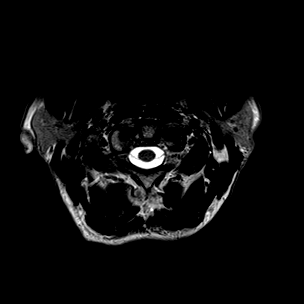

[Series 11: GRE · axial · 3.0mm · 0.39mm/px · z∈[-128,-4]mm · 8 of 40 slices shown]
[im 1/40]
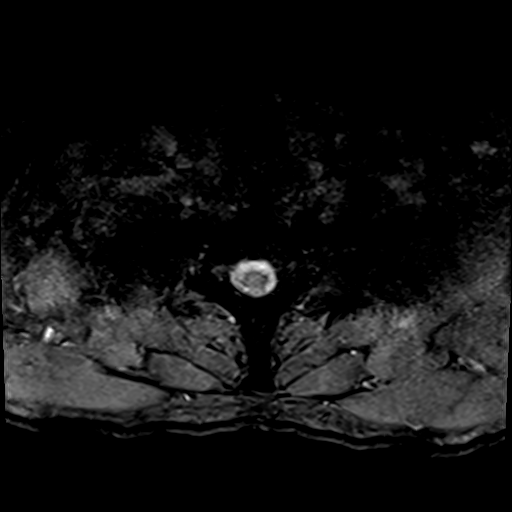
[im 6/40]
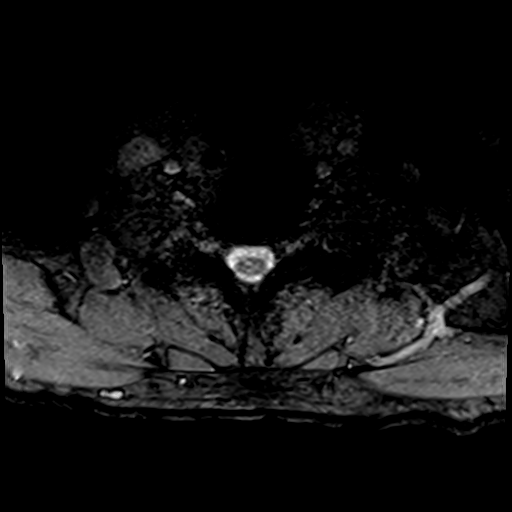
[im 12/40]
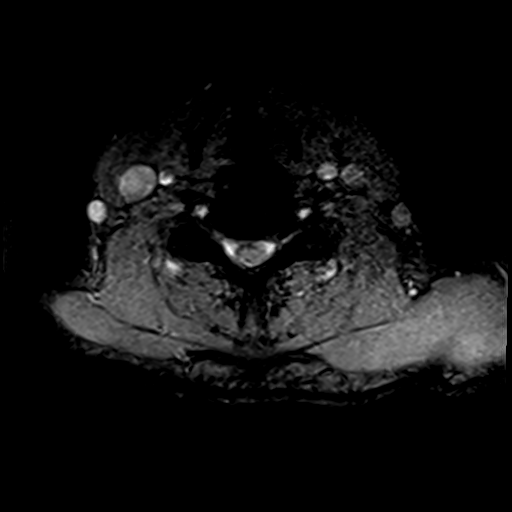
[im 17/40]
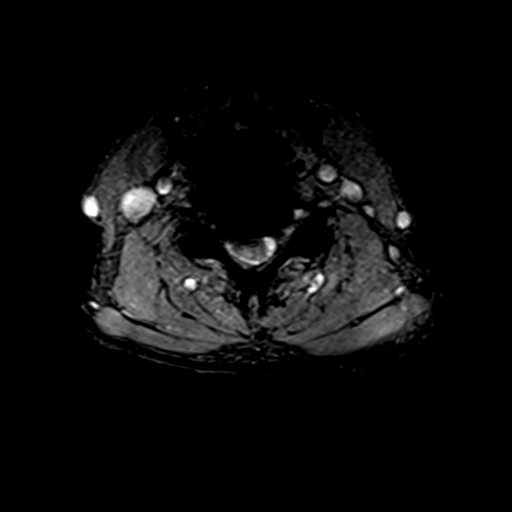
[im 23/40]
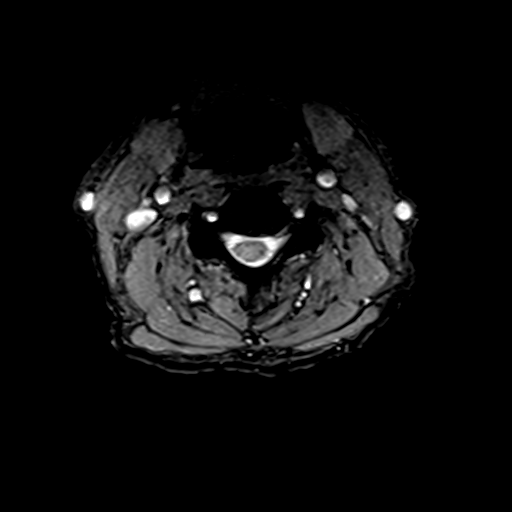
[im 28/40]
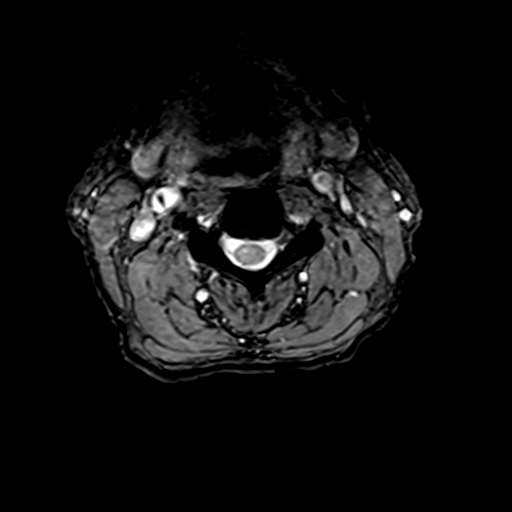
[im 34/40]
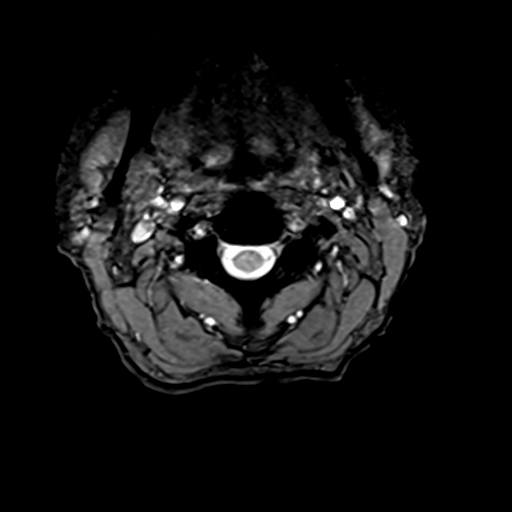
[im 40/40]
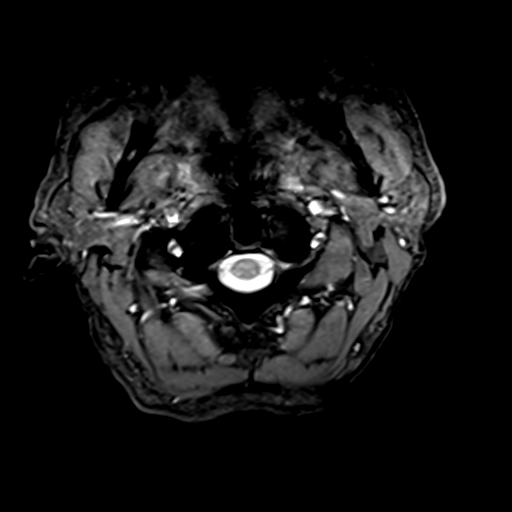

[37 of 48 positions shown; findings below may reference images not displayed]

FINDINGS: Alignment: Physiologic.

Vertebrae: No fracture, evidence of discitis, or bone lesion. Status
post ACDF at C5-C7, new from prior MRI.

Cord: Normal signal and morphology.

Posterior Fossa, vertebral arteries, paraspinal tissues: 2 cystic
appearing lesions in the left cerebellar hemisphere, the largest
measuring 11 mm is unchanged from prior MRI. The smaller, measuring
4 mm, was not included in the field of view of prior MRI.

Disc levels:

C2-3: No spinal canal or neural foraminal stenosis.

C3-4:No spinal canal or neural foraminal stenosis.

C4-5: No spinal canal or neural foraminal stenosis.

C5-6:Status post fusion. No spinal canal stenosis. Uncovertebral and
facet degenerative changes result moderate bilateral neural
foraminal neck.

C6-7: Status post fusion with small residual posterior osteophyte
without significant spinal canal stenosis. Uncovertebral and facet
degenerative changes resulting in severe bilateral neural foraminal
narrowing

C7-T1: No spinal canal or neural foraminal stenosis.
IMPRESSION: 1. Status post ACDF at C5-C7.
2. High-grade bilateral neural foraminal narrowing at C5-C6 and
C6-C7 due to uncovertebral and facet degenerative changes.
3. No significant spinal canal stenosis at any level.
4. Two cystic appearing lesions in the left cerebellar hemisphere,
the largest measuring 11 mm is unchanged from prior MRI. The
smaller, measuring 4 mm, was not included in the field of view of
prior MRI. Correlation with brain MRI suggested.

## 2019-12-31 SURGERY — MRI WITH ANESTHESIA
Anesthesia: Monitor Anesthesia Care

## 2019-12-31 MED ORDER — FENTANYL CITRATE (PF) 100 MCG/2ML IJ SOLN: 25.0000 ug | INTRAMUSCULAR | Status: DC | PRN

## 2019-12-31 MED ORDER — MIDAZOLAM HCL 2 MG/2ML IJ SOLN
INTRAMUSCULAR | Status: DC | PRN
  Administered 2019-12-31 (×2): 2 mg via INTRAVENOUS

## 2019-12-31 MED ORDER — ORAL CARE MOUTH RINSE: 15.0000 mL | Freq: Once | OROMUCOSAL | Status: AC

## 2019-12-31 MED ORDER — CHLORHEXIDINE GLUCONATE 0.12 % MT SOLN
15.0000 mL | Freq: Once | OROMUCOSAL | Status: AC
  Administered 2019-12-31: 15 mL via OROMUCOSAL
  Filled 2019-12-31: qty 15

## 2019-12-31 MED ORDER — LACTATED RINGERS IV SOLN: INTRAVENOUS | Status: DC

## 2019-12-31 MED ORDER — FENTANYL CITRATE (PF) 250 MCG/5ML IJ SOLN
INTRAMUSCULAR | Status: DC | PRN
  Administered 2019-12-31: 100 ug via INTRAVENOUS

## 2019-12-31 NOTE — Anesthesia Procedure Notes (Signed)
Procedure Name: MAC Date/Time: 12/31/2019 8:15 AM Performed by: Janace Litten, CRNA Pre-anesthesia Checklist: Patient identified, Emergency Drugs available, Suction available and Patient being monitored Patient Re-evaluated:Patient Re-evaluated prior to induction Oxygen Delivery Method: Nasal cannula

## 2019-12-31 NOTE — Transfer of Care (Signed)
Immediate Anesthesia Transfer of Care Note  Patient: Chad Mcintyre  Procedure(s) Performed: MRI WITH ANESTHESIA OF CERVICAL SPINE WITHOUT CONTRAST (N/A )  Patient Location: PACU  Anesthesia Type:MAC  Level of Consciousness: awake, alert  and patient cooperative  Airway & Oxygen Therapy: Patient Spontanous Breathing  Post-op Assessment: Report given to RN and Post -op Vital signs reviewed and stable  Post vital signs: Reviewed and stable  Last Vitals:  Vitals Value Taken Time  BP 124/88 12/31/19 0902  Temp    Pulse 69 12/31/19 0905  Resp 14 12/31/19 0905  SpO2 98 % 12/31/19 0905  Vitals shown include unvalidated device data.  Last Pain:  Vitals:   12/31/19 0654  TempSrc:   PainSc: 4          Complications: No complications documented.

## 2020-01-01 ENCOUNTER — Encounter (HOSPITAL_COMMUNITY): Payer: Self-pay | Admitting: Radiology

## 2020-01-01 NOTE — Anesthesia Postprocedure Evaluation (Signed)
Anesthesia Post Note  Patient: Chad Mcintyre  Procedure(s) Performed: MRI WITH ANESTHESIA OF CERVICAL SPINE WITHOUT CONTRAST (N/A )     Patient location during evaluation: PACU Anesthesia Type: MAC Level of consciousness: awake and alert Pain management: pain level controlled Vital Signs Assessment: post-procedure vital signs reviewed and stable Respiratory status: spontaneous breathing, nonlabored ventilation, respiratory function stable and patient connected to nasal cannula oxygen Cardiovascular status: stable and blood pressure returned to baseline Postop Assessment: no apparent nausea or vomiting Anesthetic complications: no   No complications documented.  Last Vitals:  Vitals:   12/31/19 0915 12/31/19 0918  BP:  (!) 124/98  Pulse: 75 67  Resp: 20 18  Temp:  36.5 C  SpO2: 97% 99%    Last Pain:  Vitals:   12/31/19 0654  TempSrc:   PainSc: 4                  Camdyn Beske L Gedeon Brandow

## 2020-01-02 ENCOUNTER — Other Ambulatory Visit: Payer: Self-pay | Admitting: Neurosurgery

## 2020-01-02 ENCOUNTER — Other Ambulatory Visit (HOSPITAL_COMMUNITY): Payer: Self-pay | Admitting: Neurosurgery

## 2020-01-02 DIAGNOSIS — G930 Cerebral cysts: Secondary | ICD-10-CM

## 2020-02-01 ENCOUNTER — Ambulatory Visit (HOSPITAL_COMMUNITY)
Admission: RE | Admit: 2020-02-01 | Discharge: 2020-02-01 | Disposition: A | Discharge status: Home / Self Care | Payer: No Typology Code available for payment source | Source: Ambulatory Visit | Attending: Neurosurgery | Admitting: Neurosurgery

## 2020-02-01 DIAGNOSIS — Z01812 Encounter for preprocedural laboratory examination: Secondary | ICD-10-CM | POA: Diagnosis present

## 2020-02-01 DIAGNOSIS — Z20822 Contact with and (suspected) exposure to covid-19: Secondary | ICD-10-CM | POA: Insufficient documentation

## 2020-02-02 LAB — SARS CORONAVIRUS 2 (TAT 6-24 HRS): SARS Coronavirus 2: NEGATIVE

## 2020-02-03 ENCOUNTER — Other Ambulatory Visit: Payer: Self-pay

## 2020-02-03 ENCOUNTER — Encounter (HOSPITAL_COMMUNITY): Payer: Self-pay | Admitting: *Deleted

## 2020-02-03 NOTE — Progress Notes (Signed)
Chad Mcintyre denies chest or shortness of breath at this moment. Patient was negative for Covid on  02/01/20.  Chad Mcintyre was working when I called , Patient works at a rehab facility, working directly with patients; "everyone wears a mask."

## 2020-02-04 ENCOUNTER — Other Ambulatory Visit: Payer: Self-pay

## 2020-02-04 ENCOUNTER — Ambulatory Visit (HOSPITAL_COMMUNITY): Payer: No Typology Code available for payment source | Admitting: Anesthesiology

## 2020-02-04 ENCOUNTER — Ambulatory Visit (HOSPITAL_COMMUNITY)
Admission: AD | Admit: 2020-02-04 | Discharge: 2020-02-04 | Disposition: A | Discharge status: Home / Self Care | Payer: No Typology Code available for payment source | Source: Ambulatory Visit | Attending: Neurosurgery | Admitting: Neurosurgery

## 2020-02-04 ENCOUNTER — Encounter (HOSPITAL_COMMUNITY): Admission: AD | Disposition: A | Discharge status: Home / Self Care | Payer: Self-pay | Source: Ambulatory Visit

## 2020-02-04 ENCOUNTER — Ambulatory Visit (HOSPITAL_COMMUNITY)
Admission: RE | Admit: 2020-02-04 | Discharge: 2020-02-04 | Disposition: A | Discharge status: Home / Self Care | Payer: No Typology Code available for payment source | Source: Ambulatory Visit | Attending: Neurosurgery | Admitting: Neurosurgery

## 2020-02-04 ENCOUNTER — Encounter (HOSPITAL_COMMUNITY): Payer: Self-pay | Admitting: Anesthesiology

## 2020-02-04 DIAGNOSIS — R519 Headache, unspecified: Secondary | ICD-10-CM | POA: Insufficient documentation

## 2020-02-04 DIAGNOSIS — G930 Cerebral cysts: Secondary | ICD-10-CM | POA: Diagnosis present

## 2020-02-04 DIAGNOSIS — I639 Cerebral infarction, unspecified: Secondary | ICD-10-CM | POA: Diagnosis not present

## 2020-02-04 DIAGNOSIS — Z20822 Contact with and (suspected) exposure to covid-19: Secondary | ICD-10-CM | POA: Insufficient documentation

## 2020-02-04 HISTORY — DX: Headache, unspecified: R51.9

## 2020-02-04 HISTORY — DX: Transient cerebral ischemic attack, unspecified: G45.9

## 2020-02-04 HISTORY — PX: RADIOLOGY WITH ANESTHESIA: SHX6223

## 2020-02-04 HISTORY — DX: Other complications of anesthesia, initial encounter: T88.59XA

## 2020-02-04 LAB — CBC
HCT: 46.4 % (ref 39.0–52.0)
Hemoglobin: 15.7 g/dL (ref 13.0–17.0)
MCH: 31.8 pg (ref 26.0–34.0)
MCHC: 33.8 g/dL (ref 30.0–36.0)
MCV: 94.1 fL (ref 80.0–100.0)
Platelets: 176 10*3/uL (ref 150–400)
RBC: 4.93 MIL/uL (ref 4.22–5.81)
RDW: 11.9 % (ref 11.5–15.5)
WBC: 6.9 10*3/uL (ref 4.0–10.5)
nRBC: 0 % (ref 0.0–0.2)

## 2020-02-04 LAB — BASIC METABOLIC PANEL
Anion gap: 9 (ref 5–15)
BUN: 18 mg/dL (ref 6–20)
CO2: 24 mmol/L (ref 22–32)
Calcium: 9 mg/dL (ref 8.9–10.3)
Chloride: 106 mmol/L (ref 98–111)
Creatinine, Ser: 0.95 mg/dL (ref 0.61–1.24)
GFR, Estimated: >60 mL/min (ref >60)
Glucose, Bld: 100 mg/dL — ABNORMAL HIGH (ref 70–99)
Potassium: 4.1 mmol/L (ref 3.5–5.1)
Sodium: 139 mmol/L (ref 135–145)

## 2020-02-04 LAB — SARS CORONAVIRUS 2 BY RT PCR (HOSPITAL ORDER, PERFORMED IN ~~LOC~~ HOSPITAL LAB): SARS Coronavirus 2: NEGATIVE

## 2020-02-04 IMAGING — MR MR HEAD WO/W CM
14 of 16 series · 40 of 48 positions shown · IV contrast (gadavist)
Comparison: Correlation made with prior cervical spine MRI

CLINICAL DATA: Headaches, cerebellar cysts on CT

EXAM:
MRI HEAD WITHOUT AND WITH CONTRAST
TECHNIQUE: Multiplanar, multiecho pulse sequences of the brain and surrounding
structures were obtained without and with intravenous contrast.
CONTRAST:  8mL GADAVIST GADOBUTROL 1 MMOL/ML IV SOLN

[Series 9: DWI · axial · 3.0mm · 0.88mm/px · z∈[-82,+59]mm · 6 of 96 slices shown (1 of 4)]
[im 1/96]
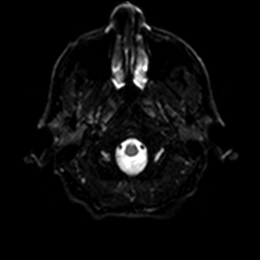
[im 20/96]
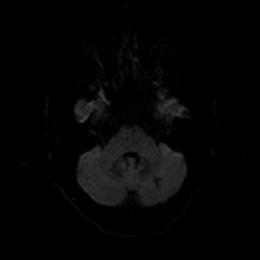
[im 39/96]
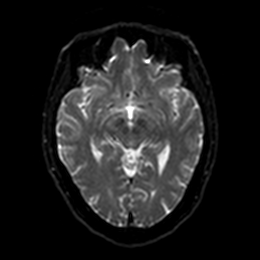
[im 58/96]
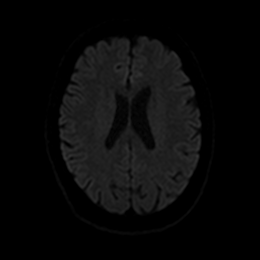
[im 77/96]
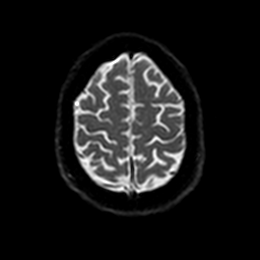
[im 96/96]
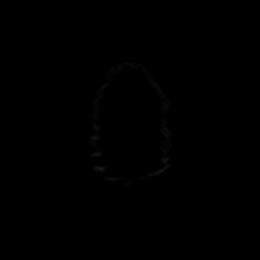

[Series 10: DWI · axial · 3.0mm · 0.88mm/px · z∈[-82,+59]mm · 2 of 48 slices shown (2 of 4)]
[im 1/48]
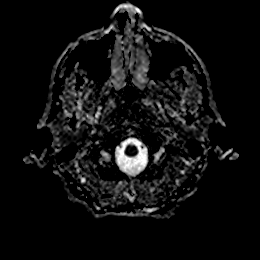
[im 48/48]
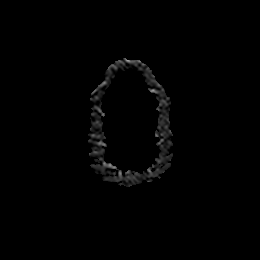

[Series 11: DWI · coronal · 4.0mm · 0.88mm/px · 4 of 64 slices shown (3 of 4)]
[im 1/64]
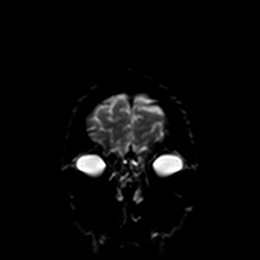
[im 22/64]
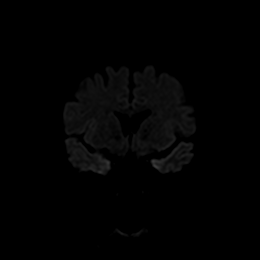
[im 43/64]
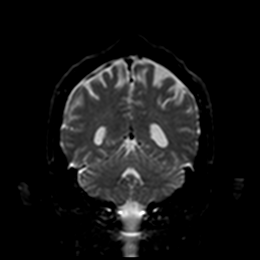
[im 64/64]
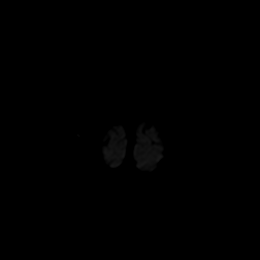

[Series 12: DWI · coronal · 4.0mm · 0.88mm/px · 2 of 32 slices shown (4 of 4)]
[im 1/32]
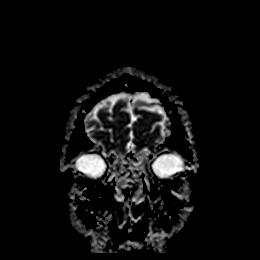
[im 32/32]
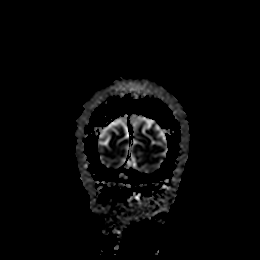

[Series 13: T1 · sagittal · 5.0mm · 0.75mm/px · 2 of 23 slices shown]
[im 1/23]
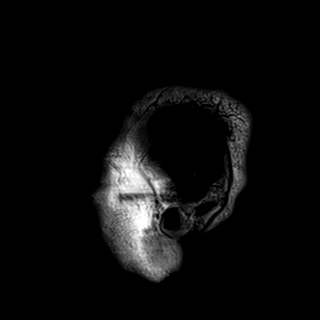
[im 23/23]
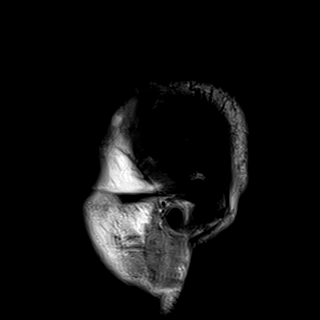

[Series 14: T2 · axial · 5.0mm · 0.72mm/px · z∈[-84,+60]mm · 2 of 25 slices shown]
[im 1/25]
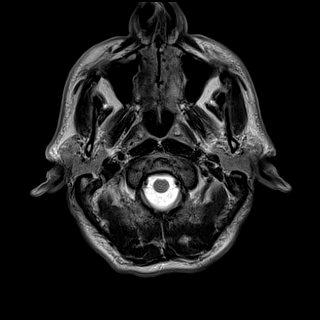
[im 25/25]
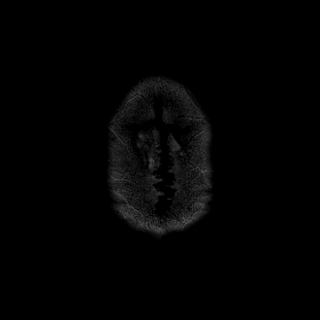

[Series 15: FLAIR · axial · 5.0mm · 0.45mm/px · z∈[-85,+59]mm · 2 of 25 slices shown (1 of 2)]
[im 1/25]
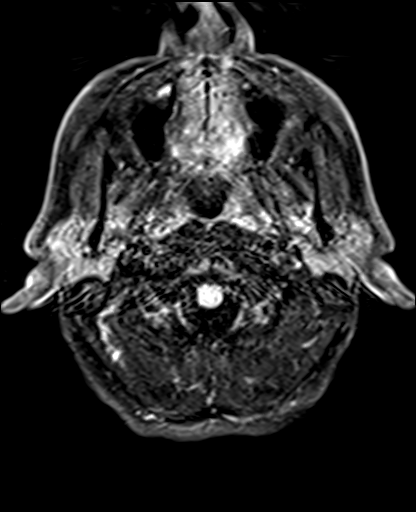
[im 25/25]
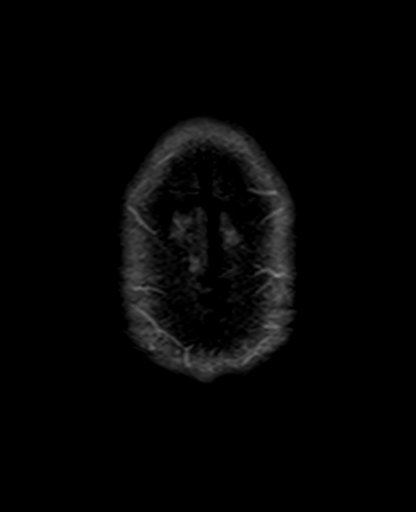

[Series 16: mag_images · axial · 3.0mm · 0.90mm/px · z∈[-87,+66]mm · 4 of 52 slices shown]
[im 1/52]
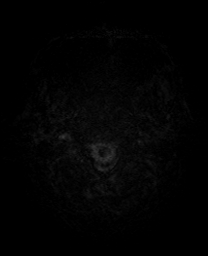
[im 18/52]
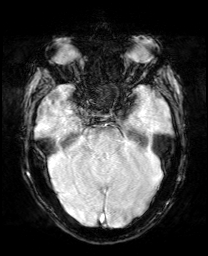
[im 35/52]
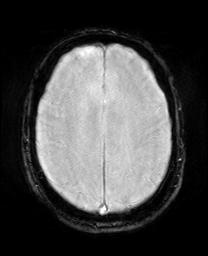
[im 52/52]
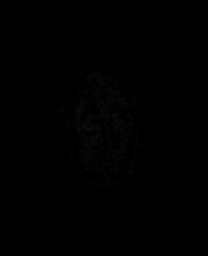

[Series 17: pha_images · axial · 3.0mm · 0.90mm/px · z∈[-87,+66]mm · 4 of 52 slices shown]
[im 1/52]
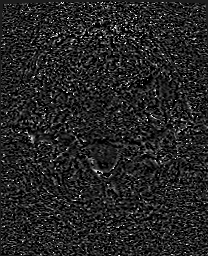
[im 18/52]
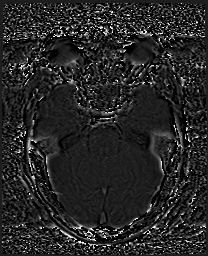
[im 35/52]
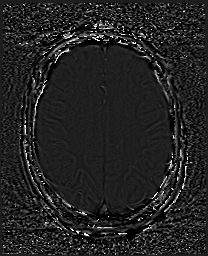
[im 52/52]
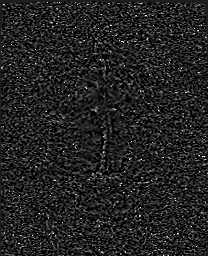

[Series 18: swi_images · axial · 3.0mm · 0.90mm/px · z∈[-87,+66]mm · 4 of 52 slices shown]
[im 1/52]
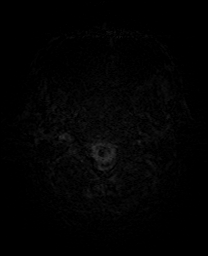
[im 18/52]
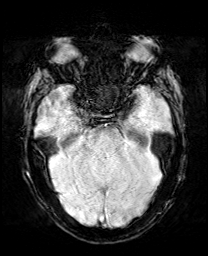
[im 35/52]
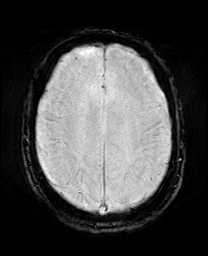
[im 52/52]
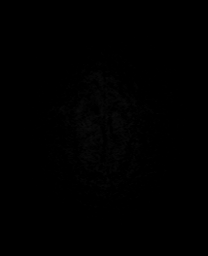

[Series 21: FLAIR · axial · 5.0mm · 0.90mm/px · z∈[-84,+60]mm · 2 of 25 slices shown (2 of 2)]
[im 1/25]
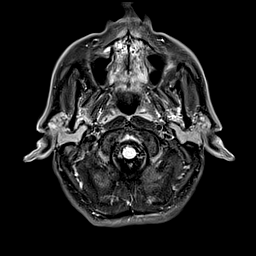
[im 25/25]
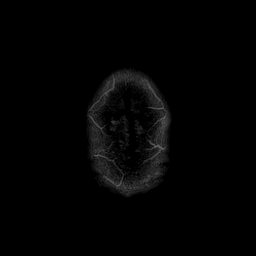

[Series 22: T2 post-contrast · coronal · 5.0mm · 0.72mm/px · 2 of 28 slices shown]
[im 1/28]
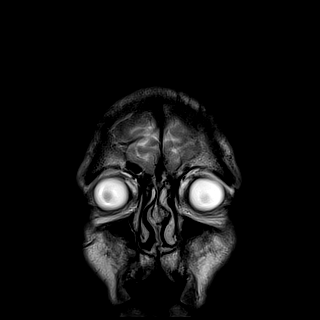
[im 28/28]
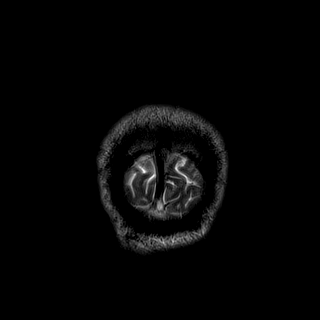

[Series 24: T1 post-contrast · coronal · 5.0mm · 0.34mm/px · 2 of 28 slices shown (1 of 2)]
[im 1/28]
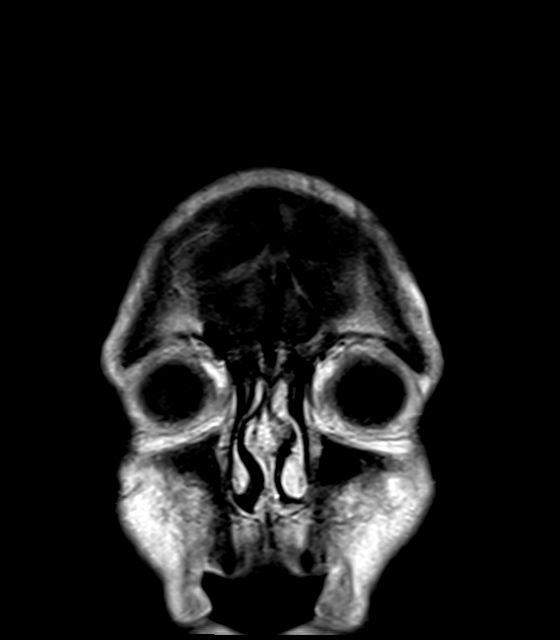
[im 28/28]
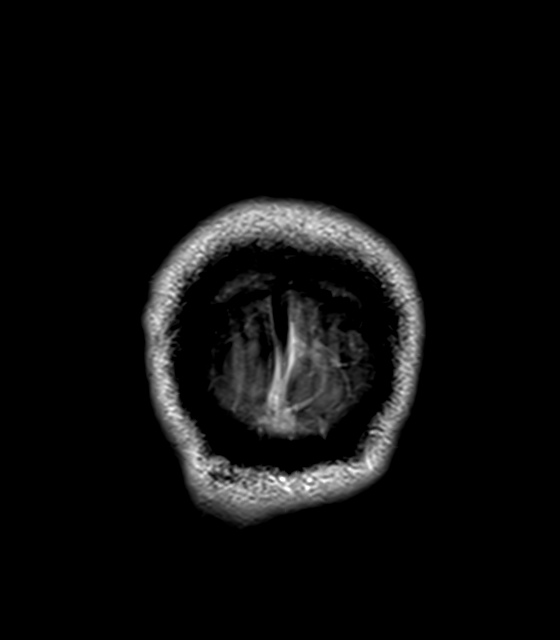

[Series 25: T1 post-contrast · sagittal · 5.0mm · 0.72mm/px · 2 of 23 slices shown (2 of 2)]
[im 1/23]
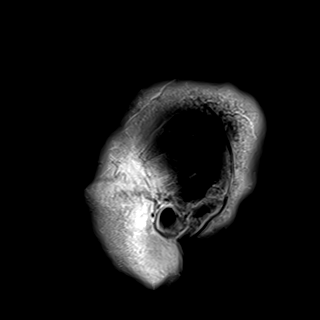
[im 23/23]
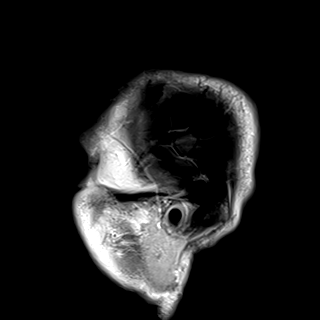

[40 of 48 positions shown; findings below may reference images not displayed]

FINDINGS: Brain: There are two chronic infarcts of the left cerebellum.
Additional small chronic right cerebellar infarcts are present.
There is no cyst present.

There is no acute infarction or intracranial hemorrhage. There is no
intracranial mass, mass effect, or edema. There is no hydrocephalus
or extra-axial fluid collection. Ventricles and sulci are normal in
size and configuration. No abnormal enhancement.

Vascular: Major vessel flow voids at the skull base are preserved.

Skull and upper cervical spine: Normal marrow signal is preserved.

Sinuses/Orbits: Ethmoid dominant mucosal thickening. Orbits are
unremarkable.

Other: Sella is unremarkable.  Mastoid air cells are clear.
IMPRESSION: Chronic cerebellar infarcts.  There is no cyst.

## 2020-02-04 SURGERY — MRI WITH ANESTHESIA
Anesthesia: General

## 2020-02-04 MED ORDER — MIDAZOLAM HCL 5 MG/5ML IJ SOLN
INTRAMUSCULAR | Status: DC | PRN
  Administered 2020-02-04 (×2): 2 mg via INTRAVENOUS

## 2020-02-04 MED ORDER — ORAL CARE MOUTH RINSE: 15.0000 mL | Freq: Once | OROMUCOSAL | Status: AC

## 2020-02-04 MED ORDER — GADOBUTROL 1 MMOL/ML IV SOLN
8.0000 mL | Freq: Once | INTRAVENOUS | Status: AC | PRN
  Administered 2020-02-04: 8 mL via INTRAVENOUS

## 2020-02-04 MED ORDER — CHLORHEXIDINE GLUCONATE 0.12 % MT SOLN
15.0000 mL | Freq: Once | OROMUCOSAL | Status: AC
  Administered 2020-02-04: 15 mL via OROMUCOSAL
  Filled 2020-02-04: qty 15

## 2020-02-04 MED ORDER — FENTANYL CITRATE (PF) 100 MCG/2ML IJ SOLN
INTRAMUSCULAR | Status: DC | PRN
  Administered 2020-02-04: 100 ug via INTRAVENOUS

## 2020-02-04 MED ORDER — LACTATED RINGERS IV SOLN: INTRAVENOUS | Status: DC

## 2020-02-04 NOTE — Addendum Note (Signed)
Addendum  created 02/04/20 0957 by Adria Dill, CRNA   Flowsheet accepted

## 2020-02-04 NOTE — Anesthesia Postprocedure Evaluation (Signed)
Anesthesia Post Note  Patient: Chad Mcintyre  Procedure(s) Performed: MRI WITH ANESTHESIA  BRAIN WITH AND WITHOUT CONTRAST (N/A )     Patient location during evaluation: PACU Anesthesia Type: MAC Level of consciousness: awake and alert and oriented Pain management: pain level controlled Vital Signs Assessment: post-procedure vital signs reviewed and stable Respiratory status: spontaneous breathing, nonlabored ventilation and respiratory function stable Cardiovascular status: blood pressure returned to baseline and stable Postop Assessment: no apparent nausea or vomiting Anesthetic complications: no   No complications documented.  Last Vitals:  Vitals:   02/04/20 0907 02/04/20 0913  BP: (!) 119/92 (!) 121/97  Pulse: 76 71  Resp: 16 12  Temp: 36.4 C   SpO2: 99% 99%    Last Pain:  Vitals:   02/04/20 0907  TempSrc:   PainSc: 0-No pain                 [name]

## 2020-02-04 NOTE — Transfer of Care (Signed)
Immediate Anesthesia Transfer of Care Note  Patient: Chad Mcintyre  Procedure(s) Performed: MRI WITH ANESTHESIA  BRAIN WITH AND WITHOUT CONTRAST (N/A )  Patient Location: PACU  Anesthesia Type:MAC  Level of Consciousness: awake, alert , oriented and patient cooperative  Airway & Oxygen Therapy: Patient Spontanous Breathing  Post-op Assessment: Report given to RN and Post -op Vital signs reviewed and stable  Post vital signs: Reviewed and stable  Last Vitals:  Vitals Value Taken Time  BP 119/92 02/04/20 0906  Temp    Pulse 69 02/04/20 0906  Resp 16 02/04/20 0906  SpO2 99 % 02/04/20 0906  Vitals shown include unvalidated device data.  Last Pain:  Vitals:   02/04/20 0641  TempSrc:   PainSc: 3       Patients Stated Pain Goal: 3 (03/83/33 8329)  Complications: No complications documented.

## 2020-02-04 NOTE — Anesthesia Procedure Notes (Signed)
Procedure Name: MAC Date/Time: 02/04/2020 8:15 AM Performed by: Renato Shin, CRNA Pre-anesthesia Checklist: Patient identified, Emergency Drugs available, Suction available, Timeout performed and Patient being monitored Patient Re-evaluated:Patient Re-evaluated prior to induction Oxygen Delivery Method: Nasal cannula Preoxygenation: Pre-oxygenation with 100% oxygen Induction Type: IV induction Placement Confirmation: positive ETCO2 and breath sounds checked- equal and bilateral Dental Injury: Teeth and Oropharynx as per pre-operative assessment

## 2020-02-04 NOTE — Anesthesia Preprocedure Evaluation (Addendum)
Anesthesia Evaluation  Patient identified by MRN, date of birth, ID band Patient awake    Reviewed: Allergy & Precautions, NPO status , Patient's Chart, lab work & pertinent test results  History of Anesthesia Complications (+) history of anesthetic complications  Airway Mallampati: II  TM Distance: >3 FB Neck ROM: Limited    Dental  (+) Missing, Poor Dentition,    Pulmonary shortness of breath and with exertion, sleep apnea , Current Smoker and Patient abstained from smoking.,    Pulmonary exam normal breath sounds clear to auscultation       Cardiovascular Normal cardiovascular exam Rhythm:Regular Rate:Normal  Hx/o interatrial shunt 2016 TEE ? ASD   Stress Echo 05/27/19 [facility]): Findings: The patient achieved 87% of maximum predicted heart rate.  The maximal heart rate achieved was 144 bpm.  The maximum blood pressure achieved was 160/98 mmHg.  The patient exercised for 8:01 protocol.  METS achieved was 13.40.  The patient exhibited fatigue during exercise.  The patient has normal functional capacity. There was normal augmentation of all left ventricular wall segments post exercise. Summary: Negative exercise stress echocardiogram and adequate heart rate.   Echo 04/12/19 ([facility]): Summary: The left ventricle is normal in size. There is mild concentric left ventricular hypertrophy. Left ventricular systolic function is normal. Ejection fraction is 50 to 55% (visual estimation). The right ventricle is normal in size and function. The tricuspid valve is grossly normal. There is trace tricuspid regurgitation. Right atrial size is normal. There is mild mitral valve thickening. The left atrial size is normal. The pulmonic valve is not well-visualized. The aortic valve leaflets are not well visualized. The aortic root is normal size. There is no pericardial effusion.   Neuro/Psych  Headaches, Cerebellar  cyst Dysphagia Paresthesia  ACOM intracranial aneurysm (s/p coil embolization 03/25/15)   TIA   GI/Hepatic Neg liver ROS, hiatal hernia, GERD  ,  Endo/Other  negative endocrine ROS  Renal/GU negative Renal ROS  negative genitourinary   Musculoskeletal  (+) Arthritis , Cervical DDD S/P Fusion   Abdominal   Peds  Hematology negative hematology ROS (+)   Anesthesia Other Findings   Reproductive/Obstetrics                         Anesthesia Physical Anesthesia Plan  ASA: II  Anesthesia Plan: MAC   Post-op Pain Management:    Induction: Intravenous  PONV Risk Score and Plan: 1 and Ondansetron and Treatment may vary due to age or medical condition  Airway Management Planned: Natural Airway and Nasal Cannula  Additional Equipment:   Intra-op Plan:   Post-operative Plan: Extubation in OR  Informed Consent: I have reviewed the patients History and Physical, chart, labs and discussed the procedure including the risks, benefits and alternatives for the proposed anesthesia with the patient or authorized representative who has indicated his/her understanding and acceptance.     Dental advisory given  Plan Discussed with: CRNA and Anesthesiologist  Anesthesia Plan Comments:       Anesthesia Quick Evaluation                                  Anesthesia Evaluation  Patient identified by MRN, date of birth, ID band Patient awake    Reviewed: Allergy & Precautions, NPO status , Patient's Chart, lab work & pertinent test results  Airway Mallampati: II  TM Distance: >3 FB Neck ROM: Full  Dental  (+) Missing, Dental Advisory Given,    Pulmonary sleep apnea (does not use CPAP) , Current Smoker and Patient abstained from smoking.,    Pulmonary exam normal breath sounds clear to auscultation       Cardiovascular Normal cardiovascular exam Rhythm:Regular Rate:Normal  Stress Echo 05/27/19 [facility]): Findings: The patient achieved  87% of maximum predicted heart rate.  The maximal heart rate achieved was 144 bpm.  The maximum blood pressure achieved was 160/98 mmHg.  The patient exercised for 8:01 protocol.  METS achieved was 13.40.  The patient exhibited fatigue during exercise.  The patient has normal functional capacity. There was normal augmentation of all left ventricular wall segments post exercise. Summary: Negative exercise stress echocardiogram and adequate heart rate.   Echo 04/12/19 ([facility]): Summary: The left ventricle is normal in size. There is mild concentric left ventricular hypertrophy. Left ventricular systolic function is normal. Ejection fraction is 50 to 55% (visual estimation). The right ventricle is normal in size and function. The tricuspid valve is grossly normal. There is trace tricuspid regurgitation. Right atrial size is normal. There is mild mitral valve thickening. The left atrial size is normal. The pulmonic valve is not well-visualized. The aortic valve leaflets are not well visualized. The aortic root is normal size. There is no pericardial effusion.    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)    GI/Hepatic hiatal hernia, GERD  Controlled,  Endo/Other    Renal/GU      Musculoskeletal C5-6 ACDF 02/13/18   Abdominal   Peds  Hematology   Anesthesia Other Findings   Reproductive/Obstetrics                          Anesthesia Physical Anesthesia Plan  ASA: II  Anesthesia Plan: MAC   Post-op Pain Management:    Induction: Intravenous  PONV Risk Score and Plan: 0 and Midazolam  Airway Management Planned: Natural Airway and Simple Face Mask  Additional Equipment:   Intra-op Plan:   Post-operative Plan:   Informed Consent: I have reviewed the patients History and Physical, chart, labs and discussed the procedure including the risks, benefits and alternatives for the proposed anesthesia with the  patient or authorized representative who has indicated his/her understanding and acceptance.     Dental advisory given  Plan Discussed with: CRNA  Anesthesia Plan Comments: (   )     Anesthesia Quick Evaluation

## 2020-02-05 ENCOUNTER — Encounter (HOSPITAL_COMMUNITY): Payer: Self-pay | Admitting: Radiology

## 2020-04-13 ENCOUNTER — Encounter: Payer: Self-pay | Admitting: Neurology

## 2020-04-13 ENCOUNTER — Ambulatory Visit (INDEPENDENT_AMBULATORY_CARE_PROVIDER_SITE_OTHER): Admitting: Neurology

## 2020-04-13 ENCOUNTER — Telehealth: Payer: Self-pay | Admitting: Neurology

## 2020-04-13 VITALS — BP 134/80 | HR 52 | Ht 73.0 in | Wt 185.8 lb

## 2020-04-13 DIAGNOSIS — Q211 Atrial septal defect: Secondary | ICD-10-CM

## 2020-04-13 DIAGNOSIS — Q2112 Patent foramen ovale: Secondary | ICD-10-CM

## 2020-04-13 DIAGNOSIS — R930 Abnormal findings on diagnostic imaging of skull and head, not elsewhere classified: Secondary | ICD-10-CM | POA: Diagnosis not present

## 2020-04-13 NOTE — Progress Notes (Signed)
[facility] [address] [place]. [place] [address] [phone]       OFFICE CONSULT NOTE  Mr. Chad Mcintyre Date of Birth:  [birth year] Medical Record Number:  [id number]   Referring MD: Fraser Din  Reason for Referral: Stroke HPI: Chad Mcintyre is a pleasant 55 year old Caucasian male seen today for initial office consultation visit for possible stroke.  History is obtained from the patient, review of electronic medical records and I personally reviewed pertinent imaging films in PACS.  I have also reviewed his records in [place].  He has past medical history of sleep apnea, PFO, acid reflux and degenerative cervical spine disease.  Patient states he had an episode of passing out in 2016 and was found to have incidental brain aneurysm for which he underwent endovascular coiling of a 4.8 mm anterior comm artery aneurysm in [facility].  On 03/25/2015 by Dr. Delfin Edis.  Follow-up CT angiogram on 03/03/2017 showed intact vascular coil without any aneurysm recurrence.  MRI scan of the brain on 09/12/2014 had shown small CSF collections in both cerebral hemispheres and were called remote lacunar infarcts.  A follow-up MRI on 02/04/2020 showed similar appearing cerebellar cystic lesions also called infarcts but if you carefully look at the coronal images these appear to be extensions of the cerebral sulci and may represent CSF cysts.  MRI scan cervical spine on 12/31/2019 showed stable appearance of ACDF surgery at C5-C7 with high-grade bilateral neural foraminal narrowing at C5-6 and C6-7 due to degenerative changes.    The patient denies any symptoms of stroke TIA seizures or migraine headaches.  He smokes half pack per day and states that he knows he has a patent foramen ovale which was diagnosed when he was very young.  TEE done on 09/19/2014 at [facility] in [place] showed evidence of right-to-left shunt but the size of the shunt was not categorized in the  report. ROS:   14 system review of systems is positive for no complaints today except for blurred vision right eye and dryness of mouth all other systems negative PMH:  Past Medical History:  Diagnosis Date  . Acid reflux   . Complication of anesthesia    cervical fusion- "thorat shut down and was re-hospitalized."  . Dyspnea    per patient, since second COVID second vaccination, March  2021  . Headache   . History of hiatal hernia   . Interatrial cardiac shunt 09/19/2014   "injection of contrast documented an interatrial shunt" on 09/19/14 TEE at [facility]  . Paresthesia 11/18/2019  . Sleep apnea    per patient does not wear CPAP   . Stroke ([place])   . TIA (transient ischemic attack)     Social History:  Social History   Socioeconomic History  . Marital status: Married    Spouse name: Abigail Butts  . Number of children: Not on file  . Years of education: Not on file  . Highest education level: Not on file  Occupational History  . Occupation: full time  Tobacco Use  . Smoking status: Current Every Day Smoker    Packs/day: 0.50    Years: 40.00    Pack years: 20.00    Types: Cigarettes    Last attempt to quit: 02/14/2019    Years since quitting: 1.1  . Smokeless tobacco: Former Systems developer  . Tobacco comment: per patient about three-quarters packs/day  Vaping Use  . Vaping Use: Never used  Substance and Sexual Activity  . Alcohol  use: Yes    Comment: occasioanally  . Drug use: Not Currently  . Sexual activity: Not on file  Other Topics Concern  . Not on file  Social History Narrative   Lives with Wife   Right Handed   Drinks 4-6 cups caffeine daily   Social Determinants of Health   Financial Resource Strain: Not on file  Food Insecurity: Not on file  Transportation Needs: Not on file  Physical Activity: Not on file  Stress: Not on file  Social Connections: Not on file  Intimate Partner Violence: Not on file    Medications:   Current Outpatient Medications on File  Prior to Visit  Medication Sig Dispense Refill  . acetaminophen-codeine (TYLENOL #3) 300-30 MG tablet Take 1 tablet by mouth every 6 (six) hours as needed for moderate pain. 15 tablet 0  . Ascorbic Acid (VITAMIN C) 1000 MG tablet Take 1,000 mg by mouth daily.    Marland Kitchen aspirin 81 MG chewable tablet Chew 81 mg by mouth daily.    . Cholecalciferol (VITAMIN D3 PO) Take 1 tablet by mouth daily.    . diazepam (VALIUM) 5 MG tablet Take 1 tablet (5 mg total) by mouth every 8 (eight) hours as needed for anxiety. 15 tablet 0  . ibuprofen (ADVIL) 800 MG tablet Take 1 tablet (800 mg total) by mouth every 8 (eight) hours as needed for mild pain. 30 tablet 0  . Multiple Vitamins-Minerals (ZINC PO) Take 1 tablet by mouth daily.    . Tiotropium Bromide-Olodaterol (STIOLTO RESPIMAT) 2.5-2.5 MCG/ACT AERS Inhale 2 puffs into the lungs daily.    . traZODone (DESYREL) 50 MG tablet Take 50 mg by mouth at bedtime.    Marland Kitchen ibuprofen (ADVIL) 200 MG tablet Take 1,600 mg by mouth every 8 (eight) hours as needed (pain.). 8 tablets as needed for pain    . predniSONE (STERAPRED UNI-PAK 21 TAB) 10 MG (21) TBPK tablet Take as directed 21 tablet 0   No current facility-administered medications on file prior to visit.    Allergies:   Allergies  Allergen Reactions  . Oxycodone-Acetaminophen Nausea And Vomiting  . Valproic Acid Other (See Comments)    aggression     Physical Exam General: well developed, well nourished pleasant middle-age Caucasian male, seated, in no evident distress Head: head normocephalic and atraumatic.   Neck: supple with no carotid or supraclavicular bruits Cardiovascular: regular rate and rhythm, no murmurs Musculoskeletal: no deformity Skin:  no rash/petichiae Vascular:  Normal pulses all extremities  Neurologic Exam Mental Status: Awake and fully alert. Oriented to place and time. Recent and remote memory intact. Attention span, concentration and fund of knowledge appropriate. Mood and affect  appropriate.  Cranial Nerves: Fundoscopic exam reveals sharp disc margins. Pupils equal, briskly reactive to light. Extraocular movements full without nystagmus. Visual fields full to confrontation. Hearing intact. Facial sensation intact. Face, tongue, palate moves normally and symmetrically.  Motor: Normal bulk and tone. Normal strength in all tested extremity muscles. Sensory.: intact to touch , pinprick , position and vibratory sensation.  Coordination: Rapid alternating movements normal in all extremities. Finger-to-nose and heel-to-shin performed accurately bilaterally. Gait and Station: Arises from chair without difficulty. Stance is normal. Gait demonstrates normal stride length and balance . Able to heel, toe and tandem walk without difficulty.  Reflexes: 1+ and symmetric. Toes downgoing.   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  He has no clinical history suggestive of stroke or TIA or significant vascular risk factors except smoking and PFO     PLAN: I had a long discussion with the patient with regards to his abnormal MRI scan showing bilateral cerebbellarl small cystic collections which are likely benign and both related as he does not have any clinical symptomatology to suggest acute stroke to go with this.  I would recommend checking CT angiogram of brain and neck to look at his blood vessels as well as lipid profile, hemoglobin A1c.  Also check ESR, ANA antibody panel for Sjogren`s syndromes because of his complaints of dryness of his mouth and eyes.  I advised him to do regular neck stretching exercises and local heat and massage for his musculoskeletal neck and head pain.  Check transcranial Doppler bubble study to further characterize his PFO.  Continue aspirin for stroke prevention and have counseled him  to quit smoking.  He will return for follow-up in 2 months or call earlier if necessary.  Greater than 50% time during this 45-minute consultation visit was spent on counseling and coordination of care and discussion about his abnormal MRI brain results and PFO and risk for stroke and answering questions Antony Contras, MD Medical Director [place] Pager: [phone] 04/13/2020 3:09 PM  Note: This document was prepared with digital dictation and possible smart phrase technology. Any transcriptional errors that result from this process are unintentional.

## 2020-04-13 NOTE — Patient Instructions (Signed)
I had a long discussion with the patient with regards to his abnormal MRI scan showing bilateral cerebral small cystic collections which are likely benign and both related as he does not have any clinical symptomatology to suggest acute stroke to go with this.  I would recommend checking CT angiogram of brain and neck to look at his blood vessels as well as lipid profile, hemoglobin A1c.  Also check ESR, ANA antibody panel for Sjogren`s syndromes because of his complaints of dryness of his mouth and eyes.  I advised him to do regular neck stretching exercises and local heat and massage for his musculoskeletal neck and head pain.  Check transcranial Doppler bubble study to further characterize his PFO.  Continue aspirin for stroke prevention and have counseled him to quit smoking.  He will return for follow-up in 2 months or call earlier if necessary.  Patent Foramen Ovale, Adult  A foramen ovale is a hole between the upper chambers (right atrium and left atrium) of the heart. Before you are born, it is normal to have this hole in your heart. The hole allows blood to circulate through the body without having to go through the lungs. After your birth, when you are able to breathe, you do not need the foramen ovale and it usually closes. If the hole does not close, it is called a patent foramen ovale (PFO). PFO is a common condition. Most people do not know they have this hole, and they do not have any health problems caused by it. What are the causes? The cause of this condition is not known. What are the signs or symptoms? In most cases, there are no symptoms of this condition. Possible rare symptoms include:  Stroke caused by a blood clot.  Migraine headaches.  Platypnea-orthodeoxia syndrome. This is a condition in which a person has shortness of breath and decreased oxygen when seated or standing but feels better when lying down. How is this diagnosed? This condition may be diagnosed based on:  A  physical exam and your medical history.  Echocardiogram. This test uses sound waves to produce images of the heart.  Transesophageal echocardiogram (TEE). This type of echocardiogram is performed by placing a probe in the part of the body that moves food from the mouth to the stomach (esophagus).  Electrocardiogram (ECG). This test identifies changes in the electrical activity of the heart.  Cardiac MRI. This is an imaging technique that is used to visualize the heart, if further images are needed after TEE. How is this treated? Usually, no treatment is needed. If your condition is associated with symptoms or blood clots, you may need:  Medicines to prevent blood clots and strokes (anticoagulant or antiplateletmedicines).  A surgical procedure to close the hole (transcatheter closure). Follow these instructions at home:  Take over-the-counter and prescription medicines only as told by your health care provider.  Keep all follow-up visits. This is important. Contact a health care provider if:  You have a fever.  You have frequent or severe headaches. Get help right away if:  Your skin turns blue.  You have chest pain or difficulty breathing.  You have any symptoms of stroke. "BE FAST" is an easy way to remember the main warning signs of stroke: ? B - Balance. Signs are dizziness, sudden trouble walking, or loss of balance. ? E - Eyes. Signs are trouble seeing or a sudden change in vision. ? F - Face. Signs are sudden weakness or numbness of the face, or  the face or eyelid drooping on one side. ? A - Arms. Signs are weakness or numbness in an arm. This happens suddenly and usually on one side of the body. ? S - Speech. Signs are sudden trouble speaking, slurred speech, or trouble understanding what people say. ? T - Time. Time to call emergency services. Write down what time symptoms started.  You have other signs of a stroke, such as: ? A sudden, severe headache with no known  cause. ? Nausea or vomiting. ? Seizure. These symptoms may represent a serious problem that is an emergency. Do not wait to see if the symptoms will go away. Get medical help right away. Call your local emergency services (911 in the [place] Do not drive yourself to the hospital. Summary  A patent foramen ovale is a hole between the upper chambers (right atrium and left atrium) of your heart. The cause of this condition is not known.  You may not know that you have a hole in your heart, and you may not have any health problems from it.  Usually, no treatment is needed for this condition unless you have symptoms or blood clots. This information is not intended to replace advice given to you by your health care provider. Make sure you discuss any questions you have with your health care provider. Document Revised: 11/14/2019 Document Reviewed: 11/14/2019 Elsevier Patient Education  2021 [employer].

## 2020-04-13 NOTE — Telephone Encounter (Signed)
tricare/va order sent to [facility]. They will reach out to the patient to schedule.

## 2020-04-14 LAB — ANA COMPREHENSIVE PANEL
Anti JO-1: <0.2 AI (ref 0.0–0.9)
Centromere Ab Screen: <0.2 AI (ref 0.0–0.9)
Chromatin Ab SerPl-aCnc: <0.2 AI (ref 0.0–0.9)
ENA RNP Ab: <0.2 AI (ref 0.0–0.9)
ENA SM Ab Ser-aCnc: <0.2 AI (ref 0.0–0.9)
ENA SSA (RO) Ab: <0.2 AI (ref 0.0–0.9)
ENA SSB (LA) Ab: <0.2 AI (ref 0.0–0.9)
Scleroderma (Scl-70) (ENA) Antibody, IgG: <0.2 AI (ref 0.0–0.9)
dsDNA Ab: <1 IU/mL (ref 0–9)

## 2020-04-14 LAB — LIPID PANEL
Chol/HDL Ratio: 2.9 ratio (ref 0.0–5.0)
Cholesterol, Total: 112 mg/dL (ref 100–199)
HDL: 39 mg/dL — ABNORMAL LOW (ref >39)
LDL Chol Calc (NIH): 58 mg/dL (ref 0–99)
Triglycerides: 73 mg/dL (ref 0–149)
VLDL Cholesterol Cal: 15 mg/dL (ref 5–40)

## 2020-04-14 LAB — HEMOGLOBIN A1C
Est. average glucose Bld gHb Est-mCnc: 103 mg/dL
Hgb A1c MFr Bld: 5.2 % (ref 4.8–5.6)

## 2020-04-14 LAB — SEDIMENTATION RATE: Sed Rate: 2 mm/hr (ref 0–30)

## 2020-04-17 ENCOUNTER — Other Ambulatory Visit: Payer: Self-pay | Admitting: Geriatric Medicine

## 2020-04-17 NOTE — Progress Notes (Signed)
Kindly let the patient know that all lab work including cholesterol profile, screening test for diabetes, inflammatory blood markers were unremarkable

## 2020-04-21 ENCOUNTER — Telehealth: Payer: Self-pay

## 2020-04-21 NOTE — Telephone Encounter (Signed)
Per Dr. Marlis Edelson note on pt's lab results: "Kindly let the patient know that all lab work including cholesterol profile, screening test for diabetes, inflammatory blood markers were unremarkable."  I called patient.  I discussed his lab results.  Patient reports that he has not heard about his carotid Doppler study.  Patient is also wondering if he can have his CT scans done at [facility].  I will forward his questions to [place].  Patient verbalized understanding of results.

## 2020-04-21 NOTE — Telephone Encounter (Signed)
Noted, patient is scheduled at [facility] for 05/15/20 to arrive at 12:30 pm. That was their first available. I left a voicemail on the patient phone informing him of the appointment. I also informed him he needs to only have clear liquids 4 hours prior to exam. I left their number of [phone] incase he needed to r/s.

## 2020-04-22 ENCOUNTER — Encounter: Payer: Self-pay | Admitting: *Deleted

## 2020-04-22 NOTE — Telephone Encounter (Signed)
I have talked to Patient and Lupita Leash at [facility] Order was placed For [facility] Bubbles will have to be done at [facility] Dr. Pearlean Brownie Or Dr. Roda Shutters . Lupita Leash Will Change Change order to [facility] . No Auth Req. .  Order was not seen [facility] - work [employer] .

## 2020-04-29 ENCOUNTER — Other Ambulatory Visit (HOSPITAL_COMMUNITY): Payer: No Typology Code available for payment source

## 2020-04-29 ENCOUNTER — Ambulatory Visit: Payer: No Typology Code available for payment source | Admitting: Nurse Practitioner

## 2020-05-11 ENCOUNTER — Ambulatory Visit: Payer: No Typology Code available for payment source

## 2020-05-12 ENCOUNTER — Ambulatory Visit (HOSPITAL_COMMUNITY)
Admission: RE | Admit: 2020-05-12 | Discharge: 2020-05-12 | Disposition: A | Discharge status: Home / Self Care | Source: Ambulatory Visit | Attending: Neurology | Admitting: Neurology

## 2020-05-12 ENCOUNTER — Other Ambulatory Visit: Payer: Self-pay

## 2020-05-12 DIAGNOSIS — Q2112 Patent foramen ovale: Secondary | ICD-10-CM

## 2020-05-12 DIAGNOSIS — Q211 Atrial septal defect: Secondary | ICD-10-CM | POA: Diagnosis not present

## 2020-05-12 NOTE — Progress Notes (Signed)
Transcranial Doppler with bubbles completed with Dr. Roda Shutters.  Refer to "CV Proc" under chart review to view preliminary results.  05/12/2020 2:12 PM Eula Fried., MHA, RVT, RDCS, RDMS

## 2020-05-15 ENCOUNTER — Ambulatory Visit (HOSPITAL_COMMUNITY): Payer: No Typology Code available for payment source

## 2020-05-15 DIAGNOSIS — Q2112 Patent foramen ovale: Secondary | ICD-10-CM | POA: Insufficient documentation

## 2020-05-15 DIAGNOSIS — Q211 Atrial septal defect: Secondary | ICD-10-CM | POA: Insufficient documentation

## 2020-05-20 ENCOUNTER — Ambulatory Visit (HOSPITAL_COMMUNITY)
Admission: RE | Admit: 2020-05-20 | Discharge: 2020-05-20 | Disposition: A | Discharge status: Home / Self Care | Payer: No Typology Code available for payment source | Source: Ambulatory Visit | Attending: Neurology | Admitting: Neurology

## 2020-05-20 ENCOUNTER — Other Ambulatory Visit: Payer: Self-pay

## 2020-05-20 DIAGNOSIS — R930 Abnormal findings on diagnostic imaging of skull and head, not elsewhere classified: Secondary | ICD-10-CM | POA: Insufficient documentation

## 2020-05-20 LAB — POCT I-STAT CREATININE: Creatinine, Ser: 1 mg/dL (ref 0.61–1.24)

## 2020-05-20 IMAGING — CT CT ANGIO HEAD
1 of 11 series · 5 of 35 positions shown · IV contrast (omnipaque)
Comparison: Brain MRI [DATE].  Cervical spine MRI [DATE].

CLINICAL DATA: 54-year-old male with persistent "pressure in head"
symptoms. Chronic small cerebellar infarcts on MRI.

EXAM:
CT ANGIOGRAPHY HEAD AND NECK
TECHNIQUE: Multidetector CT imaging of the head and neck was performed using
the standard protocol during bolus administration of intravenous
contrast. Multiplanar CT image reconstructions and MIPs were
obtained to evaluate the vascular anatomy. Carotid stenosis
measurements (when applicable) are obtained utilizing NASCET
criteria, using the distal internal carotid diameter as the
denominator.
CONTRAST:  75mL OMNIPAQUE IOHEXOL 350 MG/ML SOLN

[Series 10: ax thins · axial · 0.39mm/px · z∈[+1376,+1631]mm · 5 of 386 slices shown]
[im 65/386  soft-tissue]
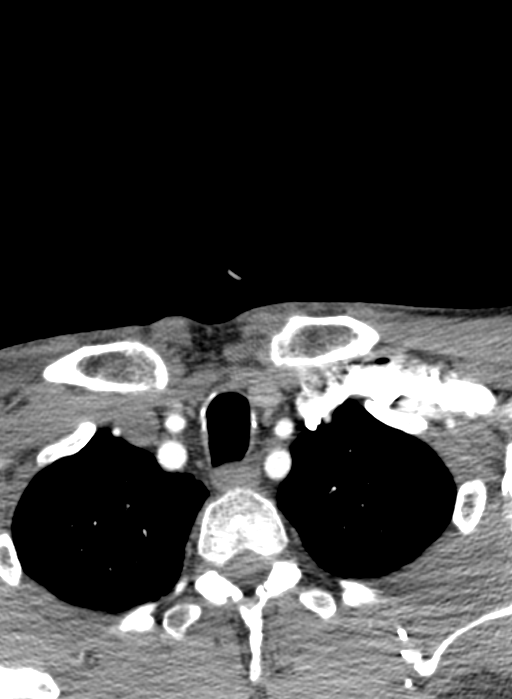
[im 129/386  bone]
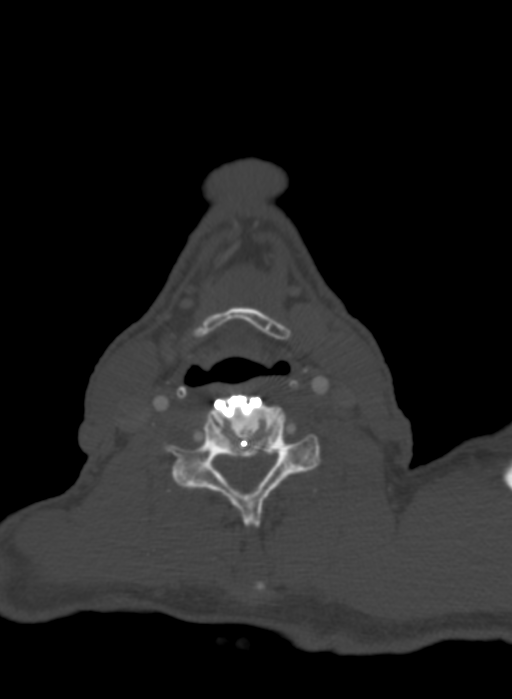
[im 193/386  soft-tissue]
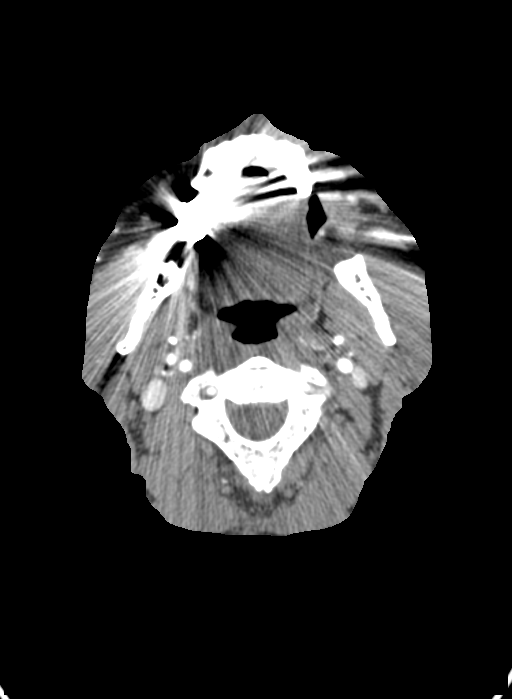
[im 257/386  bone]
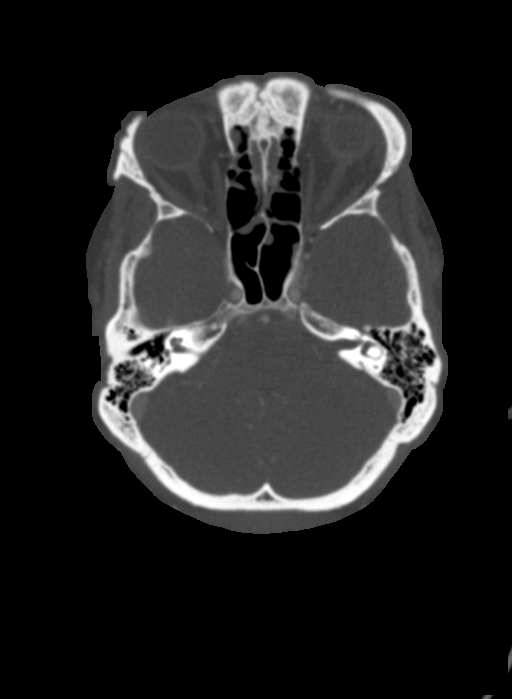
[im 321/386  soft-tissue]
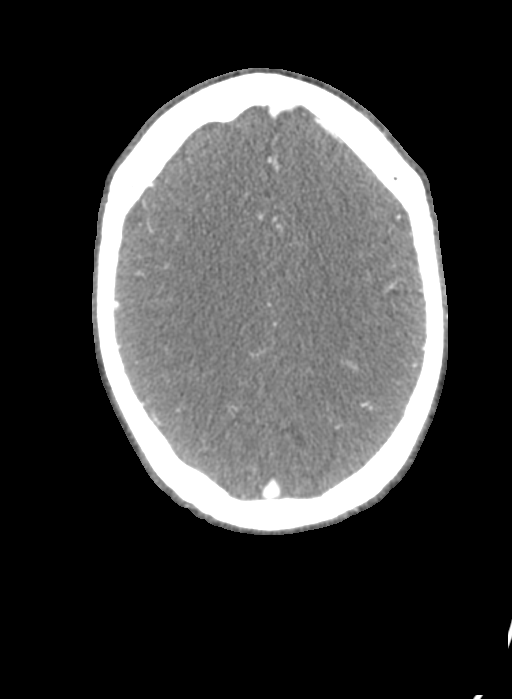

[5 of 35 positions shown; findings below may reference images not displayed]

FINDINGS: CT HEAD

Brain: Mild streak artifact from an anterior communicating artery
aneurysm region endovascular coil pack. Small cerebellar infarcts
better demonstrated by MRI, the largest is stable on series 4, image
13. Background cerebral volume within normal limits. No midline
shift, ventriculomegaly, mass effect, evidence of mass lesion,
intracranial hemorrhage or evidence of cortically based acute
infarction. No supratentorial encephalomalacia identified.

Calvarium and skull base: Negative.

Paranasal sinuses: Improved ethmoid sinus aeration since [REDACTED].
Other paranasal sinuses remain well aerated. Tympanic cavities and
mastoids appear clear.

Orbits: Visualized orbits and scalp soft tissues are within normal
limits.

CTA NECK

Skeleton: Previous C5-C6 and C6-C7 ACDF. No hardware loosening
identified but probable pseudoarthrosis at both levels. No acute
osseous abnormality identified.

Upper chest: Subtle centrilobular emphysema in the upper lungs.
Negative visible superior mediastinum.

Other neck: No acute finding in the neck.

Aortic arch: 3 vessel arch configuration.  No arch atherosclerosis.

Right carotid system: Negative.

Left carotid system: Negative.

Vertebral arteries:
Normal proximal right subclavian artery and right vertebral artery
origin. Right vertebral artery is patent and normal to the skull
base.

Normal proximal left subclavian artery and left vertebral artery
origin. Codominant left vertebral artery appears patent and normal
to the skull base.

CTA HEAD

Posterior circulation: Both vertebral artery V4 segments are patent
without plaque or stenosis. Patent PICA origins just proximal to the
vertebrobasilar junction, which is mildly fenestrated (normal
variant). Patent basilar artery without stenosis. Normal SCA and PCA
origins. Posterior communicating arteries are present, the right
side is larger. Bilateral PCA branches are within normal limits.

Anterior circulation: Both ICA siphons are patent without plaque or
stenosis. Normal posterior communicating artery origins. Normal
right ophthalmic artery origin. The left ophthalmic artery origin
has a proximal location from the distal cavernous ICA (series 10,
image 128-normal variant) but is otherwise normal.

Patent carotid termini. Patent MCA and ACA origins. Dominant left
ACA A1 segment. Anterior communicating artery embolization coil
pack. No aneurysmal enhancement identified. Adjacent A1 and A2
segments remain patent without visible stenosis. Proximal A2 is a
are tortuous. Other ACA branches appear normal.

Left MCA M1 segment and trifurcation are patent without stenosis.
Right MCA M1 segment and bifurcation are patent without stenosis.
Bilateral MCA branches are within normal limits.

Venous sinuses: Patent.

Anatomic variants: Dominant left ACA A1 segment.

Review of the MIP images confirms the above findings
IMPRESSION: 1. Previous coil embolization of an anterior communicating artery
aneurysm with no adverse features.
2. Otherwise negative CTA Head and Neck. No atherosclerosis or
stenosis identified. Unremarkable posterior circulation.
3. Previous C5-C6 and C6-C7 ACDF with probable pseudoarthrosis at
both levels.
4. Mild Emphysema ([4K]-[4K]).

## 2020-05-20 MED ORDER — IOHEXOL 350 MG/ML SOLN
75.0000 mL | Freq: Once | INTRAVENOUS | Status: AC | PRN
  Administered 2020-05-20: 75 mL via INTRAVENOUS

## 2020-05-27 ENCOUNTER — Telehealth: Payer: Self-pay | Admitting: Emergency Medicine

## 2020-05-27 NOTE — Telephone Encounter (Signed)
-----   Message from Micki Riley, MD sent at 05/27/2020  7:00 AM EDT ----- Kindly inform the patient that CT angiogram study of the brain and neck shows no major blockages to worry about.  The previously coiled aneurysm appears to be stable.  Incidental changes of previous neck surgery are noted in the upper cervical spine.  Nothing to worry about ----- Message ----- From: Interface, Rad Results In Sent: 05/21/2020   4:44 AM EDT To: Micki Riley, MD

## 2020-05-29 NOTE — Progress Notes (Signed)
Kindly inform the patient that transcranial Doppler bubble study showed evidence of a medium size right-to-left shunt likely in the heart.  He needs to make an appointment to see me in the office to discuss treatment options

## 2020-06-02 ENCOUNTER — Encounter: Payer: Self-pay | Admitting: Emergency Medicine

## 2020-06-02 ENCOUNTER — Telehealth: Payer: Self-pay | Admitting: Emergency Medicine

## 2020-06-02 NOTE — Telephone Encounter (Signed)
LVM for return call. 

## 2020-06-09 ENCOUNTER — Encounter: Payer: Self-pay | Admitting: Gastroenterology

## 2020-06-09 NOTE — Progress Notes (Signed)
Referring Provider: Tanna Furry, MD Primary Care Physician:  Tanna Furry, MD Primary Gastroenterologist:  Dr. Jena Gauss  Chief Complaint  Patient presents with  . Colonoscopy    Never had tcs. No problems. No fhcrc    HPI:   Chad Mcintyre is a 55 y.o. male presenting today at the request of [name], [name], MD for colon cancer screening.  Office visit due to medical history and medications.  History of sleep apnea, insomnia, depression, right to left cardiac shunt, acid reflux, degenerative cervical spine disease, brain aneurysm s/p endovascular coiling.   Recent transcranial Doppler with bubble 05/12/2020 revealed at least medium size right to left cardiac shunt.  Patient's heart rhythm was also irregular during recording, more consistent with bigeminy.  EKG showed frequent PVCs.  Stated heart monitor could be considered if clinically warranted.  Patient reports he has has follow-up with neurology tomorrow to discuss bubble study. Has a lot of head pressure. Occipital region radiating to the top of his head. This has been a problem for several years. Worsening. Intermittent chest discomfort. Chronic. Comes and goes. Like a pressure. No association with activity. Hasn't seen cardiology in a while. A couple years ago had a stress test and ECHO which looked good with the [place].   No abdominal pain. No constipation. Chronic intermittent loose stools. 1-2 BMs daily. Had urgency with BMs after cholecystectomy. This has resolved for the most part. No blood in the stool or black stool. No nausea, vomiting, or dysphagia.  Take 1600 mg of ibuprofen at once.  Typically at least a couple times a week.  This is for the pressure in his head.  Tylenol is not helpful.  Works as a Adult nurse.    Past Medical History:  Diagnosis Date  . Acid reflux   . Complication of anesthesia    cervical fusion- "thorat shut down and was re-hospitalized."  . Dyspnea    per patient,  since second COVID second vaccination, March  2021  . Headache   . History of hiatal hernia   . 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 Paresthesia 11/18/2019  . Sleep apnea    per patient does not wear CPAP   . Stroke [facility])    Abnormal MRI evaluated by neurology, felt abnormalities are likely benign CSF cyst rather than prior strokes.   Marland Kitchen TIA (transient ischemic attack)     Past Surgical History:  Procedure Laterality Date  . ANEURYSM COILING     per patient few years ago  . BACK SURGERY    . CHOLECYSTECTOMY    . HERNIA REPAIR    . HIATAL HERNIA REPAIR    . LAMINECTOMY AND MICRODISCECTOMY SPINE     X2  . RADIOLOGY WITH ANESTHESIA N/A 12/31/2019   Procedure: MRI WITH ANESTHESIA OF CERVICAL SPINE WITHOUT CONTRAST;  Surgeon: Radiologist, Medication, MD;  Location: [facility] OR;  Service: Radiology;  Laterality: N/A;  . RADIOLOGY WITH ANESTHESIA N/A 02/04/2020   Procedure: MRI WITH ANESTHESIA  BRAIN WITH AND WITHOUT CONTRAST;  Surgeon: Radiologist, Medication, MD;  Location: [facility] OR;  Service: Radiology;  Laterality: N/A;  . TONSILLECTOMY      Current Outpatient Medications  Medication Sig Dispense Refill  . acetaminophen-codeine (TYLENOL #3) 300-30 MG tablet Take 1 tablet by mouth every 6 (six) hours as needed for moderate  pain. 15 tablet 0  . Ascorbic Acid (VITAMIN C) 1000 MG tablet Take 1,000 mg by mouth daily.    Marland Kitchen aspirin 81 MG chewable tablet Chew 81 mg by mouth daily.    . Cholecalciferol (VITAMIN D3 PO) Take 1 tablet by mouth daily.    . diazepam (VALIUM) 5 MG tablet Take 1 tablet (5 mg total) by mouth every 8 (eight) hours as needed for anxiety. 15 tablet 0  . ibuprofen (ADVIL) 200 MG tablet Take 1,600 mg by mouth every 8 (eight) hours as needed (pain.). 8 tablets as needed for pain    . Multiple  Vitamins-Minerals (ZINC PO) Take 1 tablet by mouth daily.    . Tiotropium Bromide-Olodaterol (STIOLTO RESPIMAT) 2.5-2.5 MCG/ACT AERS Inhale 2 puffs into the lungs daily.     No current facility-administered medications for this visit.    Allergies as of 06/10/2020 - Review Complete 06/10/2020  Allergen Reaction Noted  . Oxycodone-acetaminophen Nausea And Vomiting 09/02/2009  . Valproic acid Other (See Comments) 06/05/2015    Family History  Problem Relation Age of Onset  . Kidney cancer Mother   . Stomach cancer Father   . Kidney cancer Brother   . Colon cancer Neg Hx     Social History   Socioeconomic History  . Marital status: Married    Spouse name: Toniann Fail  . Number of children: Not on file  . Years of education: Not on file  . Highest education level: Not on file  Occupational History  . Occupation: full time  Tobacco Use  . Smoking status: Current Every Day Smoker    Packs/day: 0.50    Years: 40.00    Pack years: 20.00    Types: Cigarettes    Last attempt to quit: 02/14/2019    Years since quitting: 1.3  . Smokeless tobacco: Former Neurosurgeon  . Tobacco comment: per patient about three-quarters packs/day  Vaping Use  . Vaping Use: Never used  Substance and Sexual Activity  . Alcohol use: Yes    Comment: occasioanally  . Drug use: Not Currently  . Sexual activity: Not on file  Other Topics Concern  . Not on file  Social History Narrative   Lives with Wife   Right Handed   Drinks 4-6 cups caffeine daily   Social Determinants of Health   Financial Resource Strain: Not on file  Food Insecurity: Not on file  Transportation Needs: Not on file  Physical Activity: Not on file  Stress: Not on file  Social Connections: Not on file  Intimate Partner Violence: Not on file    Review of Systems: Gen: Denies any fever, chills, presyncope, or syncope. CV: See HPI Resp: Denies shortness of breath or cough GI: See HPI GU : Denies urinary burning, urinary frequency,  urinary hesitancy MS: Denies joint pain Derm: Denies rash Psych: Denies depression or anxiety.  Has trouble sleeping. Heme: See HPI  Physical Exam: BP 137/84   Pulse 90   Temp 97.7 F (36.5 C) (Temporal)   Ht 6\' 1"  (1.854 m)   Wt 183 lb 3.2 oz (83.1 kg)   BMI 24.17 kg/m  General:   Alert and oriented. Pleasant and cooperative. Well-nourished and well-developed.  Head:  Normocephalic and atraumatic. Eyes:  Without icterus, sclera clear and conjunctiva pink.  Ears:  Normal auditory acuity. Lungs:  Clear to auscultation bilaterally. No wheezes, rales, or rhonchi. No distress.  Heart:  S1, S2 present. Rate seems irregular at times. No murmurs.  Abdomen:  +BS, soft,  non-tender and non-distended. No HSM noted. No guarding or rebound. No masses appreciated.  Rectal:  Deferred  Msk:  Symmetrical without gross deformities. Normal posture. Extremities:  Without edema. Neurologic:  Alert and  oriented x4;  grossly normal neurologically. Skin:  Intact without significant lesions or rashes. Psych:  Normal mood and affect.   Assessment: 55 year old male with history of sleep apnea, insomnia, depression, patent foramen ovale and recently found to have at least medium right to left cardiac shunt, degenerative cervical spine disease, brain aneurysm s/p endovascular coiling presenting today to discuss scheduling first-ever screening colonoscopy.  No significant GI symptoms.  No alarm symptoms.   He has follow-up with neurology tomorrow to discuss recent transcranial Doppler bubble study that revealed at least medium sized right to left cardiac shunt.  Also with heart rhythm irregularities, EKG with frequent PVCs at that time.  Patient is not sure if he will be referred to cardiology.  After further discussion, we opted to follow-up in 3 to 4 months to discuss scheduling colonoscopy at that time while he is undergoing further evaluation of cardiac shunt and heart rhythm abnormalities.  Notably,  patient is also taking significant amount of NSAIDs for pressure in his head which is chronic, but worsening.  I discussed that he was taking too much ibuprofen at 1 time (1600mg  x1) as well as my concerns for development of gastritis and possibly peptic ulcer disease.  Discussed prophylactic PPI.  Patient prefers to hold off on this for now until he has follow-up with neurology tomorrow.  Plan: 1.  We will hold off on scheduling colonoscopy for now while he is undergoing evaluation of right to left cardiac shunt and heart rhythm abnormalities. 2.  Counseled on reducing NSAIDs as much as possible. 3.  Consider prophylactic PPI due to chronic NSAID use.  Patient declined until he has follow-up with neurology tomorrow, but he will let me know if he is interested in this moving forward. 4.  Follow-up in 3-4 months to discuss scheduling colonoscopy.    , PA-C [facility] 06/10/2020

## 2020-06-10 ENCOUNTER — Encounter: Payer: Self-pay | Admitting: Gastroenterology

## 2020-06-10 ENCOUNTER — Ambulatory Visit (INDEPENDENT_AMBULATORY_CARE_PROVIDER_SITE_OTHER): Payer: No Typology Code available for payment source | Admitting: Gastroenterology

## 2020-06-10 ENCOUNTER — Other Ambulatory Visit: Payer: Self-pay

## 2020-06-10 VITALS — BP 137/84 | HR 90 | Temp 97.7°F | Ht 73.0 in | Wt 183.2 lb

## 2020-06-10 DIAGNOSIS — Z1211 Encounter for screening for malignant neoplasm of colon: Secondary | ICD-10-CM

## 2020-06-10 DIAGNOSIS — Z791 Long term (current) use of non-steroidal anti-inflammatories (NSAID): Secondary | ICD-10-CM | POA: Insufficient documentation

## 2020-06-10 DIAGNOSIS — I280 Arteriovenous fistula of pulmonary vessels: Secondary | ICD-10-CM

## 2020-06-10 NOTE — Patient Instructions (Signed)
We will plan to see you back in 3-4 months to discuss scheduling a colonoscopy at that time.  This will give you time to see neurology and possibly cardiology for further evaluation of pressure in your head and right to left cardiac shunt.  As we discussed, you are taking large amounts of ibuprofen which puts you at increased risk of developing inflammation in your stomach and possibly peptic ulcer disease.  I recommend starting a low-dose medication called Pantoprazole to suppress the acid in your stomach in order to protect your stomach from developing inflammation and ulceration.  As you requested, we will hold off for now.  Please let me know if you would like to start this medication.  It was a pleasure to meet you today!   Ermalinda Memos, PA-C [facility]

## 2020-06-11 ENCOUNTER — Ambulatory Visit (INDEPENDENT_AMBULATORY_CARE_PROVIDER_SITE_OTHER): Admitting: Neurology

## 2020-06-11 ENCOUNTER — Encounter: Payer: Self-pay | Admitting: Neurology

## 2020-06-11 VITALS — BP 127/87 | HR 87 | Ht 73.0 in | Wt 182.0 lb

## 2020-06-11 DIAGNOSIS — G44209 Tension-type headache, unspecified, not intractable: Secondary | ICD-10-CM

## 2020-06-11 DIAGNOSIS — R930 Abnormal findings on diagnostic imaging of skull and head, not elsewhere classified: Secondary | ICD-10-CM

## 2020-06-11 DIAGNOSIS — H538 Other visual disturbances: Secondary | ICD-10-CM

## 2020-06-11 MED ORDER — TOPIRAMATE 25 MG PO TABS: 25.0000 mg | ORAL_TABLET | Freq: Every day | ORAL | 2 refills | Status: DC

## 2020-06-11 NOTE — Progress Notes (Signed)
[facility] [address] [place]. [place] [address] [phone]       OFFICE FOLLOW UP VISIT NOTE  Mr. Chad Mcintyre Date of Birth:  03/16/1965 Medical Record Number:  [id number]   Referring MD: Leata Mouse  Reason for Referral: Stroke BZJ:IRCVELF visit 04/13/2020: Mr. Chad Mcintyre is a pleasant 55 year old Caucasian male seen today for initial office consultation visit for possible stroke.  History is obtained from the patient, review of electronic medical records and I personally reviewed pertinent imaging films in PACS.  I have also reviewed his records in Care Everywhere.  He has past medical history of sleep apnea, PFO, acid reflux and degenerative cervical spine disease.  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  On 03/25/2015 by Dr. Melvern Sample.  Follow-up CT angiogram on 03/03/2017 showed intact vascular coil without any aneurysm recurrence.  MRI scan of the brain on 09/12/2014 had shown small CSF collections in both cerebral hemispheres and were called remote lacunar infarcts.  A follow-up MRI on 02/04/2020 showed similar appearing cerebellar cystic lesions also called infarcts but if you carefully look at the coronal images these appear to be extensions of the cerebral sulci and may represent CSF cysts.  MRI scan cervical spine on 12/31/2019 showed stable appearance of ACDF surgery at C5-C7 with high-grade bilateral neural foraminal narrowing at C5-6 and C6-7 due to degenerative changes.    The patient denies any symptoms of stroke TIA seizures or migraine headaches.  He smokes half pack per day and states that he knows he has a patent foramen ovale which was diagnosed when he was very young.  TEE done on 09/19/2014 at [facility] in [place] showed evidence of right-to-left shunt but the size of the  shunt was not categorized in the report. Update 06/11/2020 : He returns for follow-up after last visit 2 months ago.  Patient today is complaining about head pressure that is had almost daily for the last couple of years.  Describes this as a pressure-like sensation which is mostly in the evenings he cannot sleep with so severe at times 7 out of 10 in severity.  There is no accompanying nausea vomiting light or sound sensitivity.  He does feel tightness in the back of his neck.  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  He has tried taking trazodone to help sleep which have not worked.  Patient also complains of some intermittent blurred vision in his right eye.  He denies any redness of the eyes, itching or dryness.  He did see a ophthalmologist but I do not have those records but apparently he had a normal eye exam no evidence of Cogan's arteritis.  He had lab work done on 04/13/2020 which showed LDL cholesterol 58 mg percent and hemoglobin A1c of 5.2.  ANA panel was negative.  I had also ordered Sjogren's antibodies but I do not find results for some reason.  CT angiogram of the brain and neck on 05/20/2020 shows no evidence of any large vessel stenosis or occlusion and shows coiled anterior communicating artery aneurysm without any regrowth in the aneurysm.  He had this aneurysm coiled in 2016 in [place].  Transcranial Doppler bubble study on 05/12/2020 is positive suggesting a medium size right-to-left shunt.  Patient however  denies any focal symptoms suggestive of stroke or TIA in the past or migraine headaches and this is likely an incidental finding.  Patient seems quite frustrated today due to lack of definitive answer for his diagnosis and persistent symptoms. ROS:   14 system review of systems is positive for no complaints today except for blurred vision right eye and dryness of mouth all other systems negative PMH:  Past Medical  History:  Diagnosis Date  . Acid reflux   . Complication of anesthesia    cervical fusion- "thorat shut down and was re-hospitalized."  . Dyspnea    per patient, since second COVID second vaccination, March  2021  . Headache   . History of hiatal hernia   . 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 Paresthesia 11/18/2019  . Sleep apnea    per patient does not wear CPAP   . Stroke [facility])    Abnormal MRI evaluated by neurology, felt abnormalities are likely benign CSF cyst rather than prior strokes.   Marland Kitchen TIA (transient ischemic attack)     Social History:  Social History   Socioeconomic History  . Marital status: Married    Spouse name: Toniann Fail  . Number of children: Not on file  . Years of education: Not on file  . Highest education level: Not on file  Occupational History  . Occupation: full time  Tobacco Use  . Smoking status: Current Every Day Smoker    Packs/day: 0.50    Years: 40.00    Pack years: 20.00    Types: Cigarettes    Last attempt to quit: 02/14/2019    Years since quitting: 1.3  . Smokeless tobacco: Former Neurosurgeon  . Tobacco comment: per patient about three-quarters packs/day  Vaping Use  . Vaping Use: Never used  Substance and Sexual Activity  . Alcohol use: Yes    Comment: occasioanally  . Drug use: Not Currently  . Sexual activity: Not on file  Other Topics Concern  . Not on file  Social History Narrative   Lives with Wife   Right Handed   Drinks 4-6 cups caffeine daily   Social Determinants of Health   Financial Resource Strain: Not on file  Food Insecurity: Not on file  Transportation Needs: Not on file  Physical Activity: Not on file  Stress: Not on file  Social Connections: Not on file  Intimate Partner Violence: Not on file    Medications:   Current Outpatient  Medications on File Prior to Visit  Medication Sig Dispense Refill  . acetaminophen-codeine (TYLENOL #3) 300-30 MG tablet Take 1 tablet by mouth every 6 (six) hours as needed for moderate pain. 15 tablet 0  . Ascorbic Acid (VITAMIN C) 1000 MG tablet Take 1,000 mg by mouth daily.    Marland Kitchen aspirin 81 MG chewable tablet Chew 81 mg by mouth daily.    . Cholecalciferol (VITAMIN D3 PO) Take 1 tablet by mouth daily.    . diazepam (VALIUM) 5 MG tablet Take 1 tablet (5 mg total) by mouth every 8 (eight) hours as needed for anxiety. 15 tablet 0  . ibuprofen (ADVIL) 200 MG tablet Take 1,600 mg by mouth every 8 (eight) hours as needed (pain.). 8 tablets as needed for pain    . Multiple Vitamins-Minerals (ZINC PO) Take 1 tablet by mouth daily.    Marland Kitchen  Tiotropium Bromide-Olodaterol (STIOLTO RESPIMAT) 2.5-2.5 MCG/ACT AERS Inhale 2 puffs into the lungs daily.     No current facility-administered medications on file prior to visit.    Allergies:   Allergies  Allergen Reactions  . Oxycodone-Acetaminophen Nausea And Vomiting  . Valproic Acid Other (See Comments)    aggression     Physical Exam General: well developed, well nourished pleasant middle-age Caucasian male, seated, in no evident distress.  He appears quite anxious. Head: head normocephalic and atraumatic.   Neck: supple with no carotid or supraclavicular bruits Cardiovascular: regular rate and rhythm, no murmurs Musculoskeletal: no deformity Skin:  no rash/petichiae Vascular:  Normal pulses all extremities  Neurologic Exam Mental Status: Awake and fully alert. Oriented to place and time. Recent and remote memory intact. Attention span, concentration and fund of knowledge appropriate. Mood and affect appropriate.  Cranial Nerves: Fundoscopic exam reveals sharp disc margins. Pupils equal, briskly reactive to light. Extraocular movements full without nystagmus. Visual fields full to confrontation. Hearing intact. Facial sensation intact. Face,  tongue, palate moves normally and symmetrically.  Motor: Normal bulk and tone. Normal strength in all tested extremity muscles. Sensory.: intact to touch , pinprick , position and vibratory sensation.  Coordination: Rapid alternating movements normal in all extremities. Finger-to-nose and heel-to-shin performed accurately bilaterally. Gait and Station: Arises from chair without difficulty. Stance is normal. Gait demonstrates normal stride length and balance . Able to heel, toe and tandem walk without difficulty.  Reflexes: 1+ and symmetric. Toes downgoing.      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  He has no clinical history suggestive of stroke or TIA or significant vascular risk factors except smoking and PFO     PLAN: I had a long discussion with the patient with regards to his abnormal MRI scan which likely shows benign CSF cysts rather than strokes.  We also discussed results of his lab work as well as CT angiogram and transcranial Doppler bubble study.  He does have what looks like a medium size PFO with this likely an incidental finding and in the absence of definite symptoms of strokes or TIA I would recommend conservative medical follow-up for now and I do not believe doing the TEE is necessary at this point.Marland Kitchen.  He is also complaining of daily pressure-like headaches as well as intermittent blurred vision in the right eye.  Continue aspirin for stroke prevention.  We will evaluate this further by checking MRI scan of the orbits with and without contrast and I recommend a trial of Topamax 25 twice daily as well as regular neck stretching exercises and increase participation in stress laxation activities like regular exercise, meditation and yoga.  He also has trouble sleeping and I recommend he take melatonin 10 mg at sunset and also use Tylenol  or Motrin PM at night in addition.  If his headaches do not resolve I will refer him to Dr. Lucia GaskinsAhern headache specialist iIn a month.  He will remain on aspirin 81 mg daily.  He will return for follow-up with me in the future in 3 months or call earlier if necessary.   Greater than 50% time during this 35-minute visit was spent on counseling and coordination of care and discussion about his abnormal MRI brain results, tension headache and blurred vision and PFO and risk for stroke and answering questions Delia HeadyPramod Mandolin Falwell, MD Medical Director [facility]  Pager: [phone] 06/11/2020 12:38 PM  Note: This document was prepared with digital dictation and possible smart phrase technology. Any transcriptional errors that result from this process are unintentional.

## 2020-06-11 NOTE — Patient Instructions (Addendum)
I had a long discussion with the patient with regards to his abnormal MRI scan which likely shows benign CSF cysts rather than strokes.  We also discussed results of his lab work as well as CT angiogram and transcranial Doppler bubble study.  He does have what looks like a medium size PFO with this likely an incidental finding and in the absence of definite symptoms of strokes or TIA I would recommend conservative medical follow-up for now and I do not believe doing the TEE is necessary at this point.Marland Kitchen  He is also complaining of daily pressure-like headaches as well as intermittent blurred vision in the right eye.  We will evaluate this further by checking MRI scan of the orbits with and without contrast and I recommend a trial of Topamax 25 twice daily as well as regular neck stretching exercises and increase participation in stress laxation activities like regular exercise, meditation and yoga.  He also has trouble sleeping and I recommend he take melatonin 10 mg at sunset and also use Tylenol or Motrin PM at night in addition.  If his headaches do not resolve I will refer him to Dr. Lucia Gaskins headache specialist iIn a month.  He will remain on aspirin 81 mg daily.  He will return for follow-up with me in the future in 3 months or call earlier if necessary.    Patent Foramen Ovale, Adult  A foramen ovale is a hole between the upper chambers (right atrium and left atrium) of the heart. Before you are born, it is normal to have this hole in your heart. The hole allows blood to circulate through the body without having to go through the lungs. After your birth, when you are able to breathe, you do not need the foramen ovale and it usually closes. If the hole does not close, it is called a patent foramen ovale (PFO). PFO is a common condition. Most people do not know they have this hole, and they do not have any health problems caused by it. What are the causes? The cause of this condition is not known. What are  the signs or symptoms? In most cases, there are no symptoms of this condition. Possible rare symptoms include:  Stroke caused by a blood clot.  Migraine headaches.  Platypnea-orthodeoxia syndrome. This is a condition in which a person has shortness of breath and decreased oxygen when seated or standing but feels better when lying down. How is this diagnosed? This condition may be diagnosed based on:  A physical exam and your medical history.  Echocardiogram. This test uses sound waves to produce images of the heart.  Transesophageal echocardiogram (TEE). This type of echocardiogram is performed by placing a probe in the part of the body that moves food from the mouth to the stomach (esophagus).  Electrocardiogram (ECG). This test identifies changes in the electrical activity of the heart.  Cardiac MRI. This is an imaging technique that is used to visualize the heart, if further images are needed after TEE. How is this treated? Usually, no treatment is needed. If your condition is associated with symptoms or blood clots, you may need:  Medicines to prevent blood clots and strokes (anticoagulant or antiplateletmedicines).  A surgical procedure to close the hole (transcatheter closure). Follow these instructions at home:  Take over-the-counter and prescription medicines only as told by your health care provider.  Keep all follow-up visits. This is important. Contact a health care provider if:  You have a fever.  You have  frequent or severe headaches. Get help right away if:  Your skin turns blue.  You have chest pain or difficulty breathing.  You have any symptoms of stroke. "BE FAST" is an easy way to remember the main warning signs of stroke: ? B - Balance. Signs are dizziness, sudden trouble walking, or loss of balance. ? E - Eyes. Signs are trouble seeing or a sudden change in vision. ? F - Face. Signs are sudden weakness or numbness of the face, or the face or eyelid  drooping on one side. ? A - Arms. Signs are weakness or numbness in an arm. This happens suddenly and usually on one side of the body. ? S - Speech. Signs are sudden trouble speaking, slurred speech, or trouble understanding what people say. ? T - Time. Time to call emergency services. Write down what time symptoms started.  You have other signs of a stroke, such as: ? A sudden, severe headache with no known cause. ? Nausea or vomiting. ? Seizure. These symptoms may represent a serious problem that is an emergency. Do not wait to see if the symptoms will go away. Get medical help right away. Call your local emergency services (911 in the [place] Do not drive yourself to the hospital. Summary  A patent foramen ovale is a hole between the upper chambers (right atrium and left atrium) of your heart. The cause of this condition is not known.  You may not know that you have a hole in your heart, and you may not have any health problems from it.  Usually, no treatment is needed for this condition unless you have symptoms or blood clots. This information is not intended to replace advice given to you by your health care provider. Make sure you discuss any questions you have with your health care provider. Document Revised: 11/14/2019 Document Reviewed: 11/14/2019 Elsevier Patient Education  2021 Elsevier Inc.  Tension Headache, Adult A tension headache is a feeling of pain, pressure, or aching over the front and sides of the head. The pain can be dull, or it can feel tight. There are two types of tension headache:  Episodic tension headache. This is when the headaches happen fewer than 15 days a month.  Chronic tension headache. This is when the headaches happen more than 15 days a month during a 54-month period. A tension headache can last from 30 minutes to several days. It is the most common kind of headache. Tension headaches are not normally associated with nausea or vomiting, and they do not  get worse with physical activity. What are the causes? The exact cause of this condition is not known. Tension headaches are often triggered by stress, anxiety, or depression. Other triggers may include:  Alcohol.  Too much caffeine or caffeine withdrawal.  Respiratory infections, such as colds, flu, or sinus infections.  Dental problems or teeth clenching.  Fatigue.  Holding your head and neck in the same position for a long period of time, such as while using a computer.  Smoking.  Arthritis of the neck. What are the signs or symptoms? Symptoms of this condition include:  A feeling of pressure or tightness around the head.  Dull, aching head pain.  Pain over the front and sides of the head.  Tenderness in the muscles of the head, neck, and shoulders. How is this diagnosed? This condition may be diagnosed based on your symptoms, your medical history, and a physical exam. If your symptoms are severe or unusual, you  may have imaging tests, such as a CT scan or an MRI of your head. Your vision may also be checked. How is this treated? This condition may be treated with lifestyle changes and with medicines that help relieve symptoms. Follow these instructions at home: Managing pain  Take over-the-counter and prescription medicines only as told by your health care provider.  When you have a headache, lie down in a dark, quiet room.  If directed, put ice on your head and neck. To do this: ? Put ice in a plastic bag. ? Place a towel between your skin and the bag. ? Leave the ice on for 20 minutes, 2-3 times a day. ? Remove the ice if your skin turns bright red. This is very important. If you cannot feel pain, heat, or cold, you have a greater risk of damage to the area.  If directed, apply heat to the back of your neck as often as told by your health care provider. Use the heat source that your health care provider recommends, such as a moist heat pack or a heating  pad. ? Place a towel between your skin and the heat source. ? Leave the heat on for 20-30 minutes. ? Remove the heat if your skin turns bright red. This is especially important if you are unable to feel pain, heat, or cold. You have a greater risk of getting burned. Eating and drinking  Eat meals on a regular schedule.  If you drink alcohol: ? Limit how much you have to:  0-1 drink a day for women who are not pregnant.  0-2 drinks a day for men. ? Know how much alcohol is in your drink. In the [place] one drink equals one 12 oz bottle of beer (355 mL), one 5 oz glass of wine (148 mL), or one 1 oz glass of hard liquor (44 mL).  Drink enough fluid to keep your urine pale yellow.  Decrease your caffeine intake, or stop using caffeine. Lifestyle  Get 7-9 hours of sleep each night, or get the amount of sleep recommended by your health care provider.  At bedtime, remove computers, phones, and tablets from your room.  Find ways to manage your stress. This may include: ? Exercise. ? Deep breathing exercises. ? Yoga. ? Listening to music. ? Positive mental imagery.  Try to sit up straight and avoid tensing your muscles.  Do not use any products that contain nicotine or tobacco. These include cigarettes, chewing tobacco, and vaping devices, such as e-cigarettes. If you need help quitting, ask your health care provider. General instructions  Avoid any headache triggers. Keep a journal to help find out what may trigger your headaches. For example, write down: ? What you eat and drink. ? How much sleep you get. ? Any change to your diet or medicines.  Keep all follow-up visits. This is important.   Contact a health care provider if:  Your headache does not get better.  Your headache comes back.  You are sensitive to sounds, light, or smells because of a headache.  You have nausea or you vomit.  Your stomach hurts. Get help right away if:  You suddenly develop a severe  headache, along with any of the following: ? A stiff neck. ? Nausea and vomiting. ? Confusion. ? Weakness in one part or one side of your body. ? Double vision or loss of vision. ? Shortness of breath. ? Rash. ? Unusual sleepiness. ? Fever or chills. ? Trouble speaking. ?  Pain in your eye or ear. ? Trouble walking or balancing. ? Feeling faint or passing out. Summary  A tension headache is a feeling of pain, pressure, or aching over the front and sides of the head.  A tension headache can last from 30 minutes to several days. It is the most common kind of headache.  This condition may be diagnosed based on your symptoms, your medical history, and a physical exam.  This condition may be treated with lifestyle changes and with medicines that help relieve symptoms. This information is not intended to replace advice given to you by your health care provider. Make sure you discuss any questions you have with your health care provider. Document Revised: 10/10/2019 Document Reviewed: 10/10/2019 Elsevier Patient Education  2021 [employer]

## 2020-06-15 ENCOUNTER — Other Ambulatory Visit: Payer: Self-pay | Admitting: Neurology

## 2020-06-15 ENCOUNTER — Telehealth: Payer: Self-pay | Admitting: Neurology

## 2020-06-15 DIAGNOSIS — H538 Other visual disturbances: Secondary | ICD-10-CM

## 2020-06-15 NOTE — Telephone Encounter (Signed)
Originally sent MRI to [facility] due to patient's need for open MRI, however they informed me they are not able to do MRI there due to patient having a coil in his frontal lobe. I called [facility] scheduling and they let me know that patient previously had an MRI in January and was sedated. Patient will need to go to [facility] for this but the order will need to be changed. The order needs to reflect that patient has an implanted device and will need sedation.

## 2020-06-16 NOTE — Telephone Encounter (Signed)
Faxed order to [facility] @ [phone]. Phone: [phone]. They will reach out to patient to schedule.

## 2020-06-24 ENCOUNTER — Telehealth (HOSPITAL_COMMUNITY): Payer: Self-pay

## 2020-06-24 NOTE — Telephone Encounter (Signed)
Patient is scheduled 7/7 at [facility] for sedated MRI. Received H&P request form via fax and gave to MD to sign.

## 2020-07-09 ENCOUNTER — Telehealth: Payer: Self-pay | Admitting: Emergency Medicine

## 2020-07-14 NOTE — Telephone Encounter (Signed)
LVM for patient to return call.  MyChart message is showing read.

## 2020-07-30 ENCOUNTER — Ambulatory Visit (HOSPITAL_COMMUNITY)

## 2020-07-31 ENCOUNTER — Institutional Professional Consult (permissible substitution): Payer: No Typology Code available for payment source | Admitting: Neurology

## 2020-08-24 ENCOUNTER — Telehealth: Payer: Self-pay | Admitting: Neurology

## 2020-08-24 ENCOUNTER — Encounter (HOSPITAL_COMMUNITY): Payer: Self-pay | Admitting: Certified Registered Nurse Anesthetist

## 2020-08-24 NOTE — Telephone Encounter (Signed)
[facility](Teresa) called, requesting H&P for MRI with anesthesia. Would like a call from the nurse.

## 2020-08-24 NOTE — Progress Notes (Signed)
Spoke with Billey Chang at Dr. Marlis Edelson office. I explained to her what we needed when pt's are scheduled for MRI with anesthesia (a H & P within 30 days prior to the MRI). She states that pt only sees Dr. Pearlean Brownie and he's not in office today. I asked her to please call the patient and let him know not to come tomorrow. I did tell Julianne to make sure before scheduling pt for an office visit, to be sure to have the date of the MRI so to make sure it's within 30 days prior to that date. She voiced understanding. I have notified MRI that pt will not be coming tomorrow, spoke with [place]. OR desk (Tish) notified also.

## 2020-08-24 NOTE — Telephone Encounter (Signed)
Per Aggie Cosier at [facility], pt's MRI is being cancelled tomorrow as he has not had H&P done within 30 days. Informed pt of cancellation and to give [place] a call back.

## 2020-08-25 ENCOUNTER — Encounter (HOSPITAL_COMMUNITY): Payer: Self-pay

## 2020-08-25 ENCOUNTER — Ambulatory Visit (HOSPITAL_COMMUNITY): Admission: RE | Admit: 2020-08-25 | Source: Ambulatory Visit

## 2020-08-25 ENCOUNTER — Ambulatory Visit (HOSPITAL_COMMUNITY): Admission: RE | Admit: 2020-08-25 | Source: Ambulatory Visit | Admitting: Neurology

## 2020-08-25 SURGERY — MRI WITH ANESTHESIA
Anesthesia: General

## 2020-09-01 NOTE — Telephone Encounter (Signed)
History & physical form placed in pod 2 for RN/MD to fill out & sign. Please fax form to [facility] Radiology dept @ [phone] when completed.

## 2020-09-01 NOTE — Telephone Encounter (Signed)
H&P Received.  Patient to be seen by Dr. Pearlean Brownie on 09/15/2020 at which time, the H&P will be completed and sent back to [facility].

## 2020-09-15 ENCOUNTER — Ambulatory Visit: Payer: No Typology Code available for payment source | Admitting: Neurology

## 2020-10-20 NOTE — Progress Notes (Deleted)
Referring Provider: Tanna Furry Primary Care Physician:  Tanna Furry, MD Primary GI Physician: Dr. Jena Gauss  No chief complaint on file.   HPI:   Chad Mcintyre is a 55 y.o. 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  Last seen in our office to discuss scheduling colonoscopy for colon cancer screening.  He reported he had a follow-up with neurology to discuss bubble study results.  He was having a lot of head pressure in the occipital region radiating to the top of his head which has been present for several years, but was worsening.  Intermittent chest pain that was also chronic.  Denied association with activity.  Has not seen cardiology in a while.  Reported stress test and echo a couple years ago at [place] which looked good.  Denies any significant GI symptoms.  Chronic intermittent loose stools since cholecystectomy.  Also taking 1600 mg of ibuprofen couple times a week due to head pressure.  Recommended follow-up with neurology plan follow-up in our office in 3-4 months to discuss scheduling colonoscopy.  Recommended decreasing ibuprofen use.  Discussed prophylactic PPI, the patient preferred to hold off.  Patient has had follow-up with neurology 06/11/20.  Transcranial Doppler findings was more of an incidental finding as he has no focal symptoms suggesting stroke or TIA in the past for migraine headaches.  No recommendations for TEE or further evaluation.  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  Due to continued pressure-like headaches and intermittent blurry vision in his right  eye, recommended MRI of the orbits with and without contrast and trial of Topamax 25 mg daily as well as regular neck stretching.  Consider referral to headache specialist in 1 month if ongoing symptoms.  Appears MRI was scheduled for 8/2, but was canceled as he has not had an H&P completed within 30 days.  Today:    Past Medical History:  Diagnosis Date   Acid reflux    Complication of anesthesia    cervical fusion- "thorat shut down and was re-hospitalized."   Dyspnea    per patient, since second COVID second vaccination, March  2021   Headache    History of hiatal hernia    Interatrial cardiac shunt 09/19/2014   PFO; "injection of contrast documented an interatrial shunt" on 09/19/14 TEE at [facility]; bubble study 05/12/2020 with at least medium-sized right to left cardiac shunt   Paresthesia 11/18/2019   Sleep apnea    per patient does not wear CPAP    Stroke (HCC)    Abnormal MRI evaluated by neurology, felt abnormalities are likely benign CSF cyst rather than prior strokes.    TIA (transient ischemic attack)     Past Surgical History:  Procedure Laterality Date   ANEURYSM COILING     per patient few years ago   BACK SURGERY     CHOLECYSTECTOMY     HERNIA REPAIR     HIATAL HERNIA REPAIR     LAMINECTOMY AND MICRODISCECTOMY SPINE     X2   RADIOLOGY WITH ANESTHESIA N/A 12/31/2019   Procedure: MRI WITH ANESTHESIA OF CERVICAL SPINE WITHOUT CONTRAST;  Surgeon: Radiologist, Medication, MD;  Location: [facility] OR;  Service: Radiology;  Laterality: N/A;   RADIOLOGY WITH ANESTHESIA N/A 02/04/2020   Procedure: MRI WITH ANESTHESIA  BRAIN WITH AND WITHOUT CONTRAST;  Surgeon: Radiologist, Medication, MD;  Location: [facility] OR;  Service: Radiology;  Laterality: N/A;   TONSILLECTOMY      Current Outpatient Medications  Medication Sig Dispense Refill   acetaminophen-codeine (TYLENOL #3) 300-30 MG tablet Take 1 tablet by mouth every 6 (six) hours as needed for moderate pain. 15 tablet 0    Ascorbic Acid (VITAMIN C) 1000 MG tablet Take 1,000 mg by mouth daily.     aspirin 81 MG chewable tablet Chew 81 mg by mouth daily.     Cholecalciferol (VITAMIN D3 PO) Take 1 tablet by mouth daily.     diazepam (VALIUM) 5 MG tablet Take 1 tablet (5 mg total) by mouth every 8 (eight) hours as needed for anxiety. (Patient not taking: No sig reported) 15 tablet 0   ibuprofen (ADVIL) 200 MG tablet Take 1,200-1,400 mg by mouth every 8 (eight) hours as needed (pain.).     Multiple Vitamins-Minerals (ZINC PO) Take 1 tablet by mouth daily.     Tiotropium Bromide-Olodaterol (STIOLTO RESPIMAT) 2.5-2.5 MCG/ACT AERS Inhale 2 puffs into the lungs daily.     topiramate (TOPAMAX) 25 MG tablet Take 1 tablet (25 mg total) by mouth at bedtime. (Patient not taking: No sig reported) 30 tablet 2   zinc gluconate 50 MG tablet Take 50 mg by mouth daily.     No current facility-administered medications for this visit.    Allergies as of 10/21/2020 - Review Complete 07/22/2020  Allergen Reaction Noted   Oxycodone-acetaminophen Nausea And Vomiting 09/02/2009   Depakote [divalproex sodium]  08/09/2018   Oxycodone hcl Nausea And Vomiting 01/28/2019   Valproic acid Other (See Comments) 06/05/2015    Family History  Problem Relation Age of Onset   Kidney cancer Mother    Stomach cancer Father    Kidney cancer Brother    Colon cancer Neg Hx     Social History   Socioeconomic History   Marital status: Married    Spouse name: Toniann Fail   Number of children: Not on file   Years of education: Not on file   Highest education level: Not on file  Occupational History   Occupation: full time  Tobacco Use   Smoking status: Every Day    Packs/day: 0.50    Years: 40.00    Pack years: 20.00    Types: Cigarettes    Last attempt to quit: 02/14/2019    Years since quitting: 1.6   Smokeless tobacco: Former   Tobacco comments:    per patient about three-quarters packs/day  Vaping Use   Vaping Use: Never used   Substance and Sexual Activity   Alcohol use: Yes    Comment: occasioanally   Drug use: Not Currently   Sexual activity: Not on file  Other Topics Concern   Not on file  Social History Narrative   Lives with Wife   Right Handed   Drinks 4-6 cups caffeine daily   Social Determinants of Health   Financial Resource Strain: Not on file  Food Insecurity: Not on file  Transportation Needs: Not on file  Physical Activity: Not on file  Stress: Not on file  Social Connections: Not on file    Review of Systems: Gen: Denies fever, chills, anorexia. Denies fatigue, weakness, weight loss.  CV: Denies chest pain, palpitations, syncope, peripheral edema, and claudication. Resp: Denies  dyspnea at rest, cough, wheezing, coughing up blood, and pleurisy. GI: Denies vomiting blood, jaundice, and fecal incontinence.   Denies dysphagia or odynophagia. Derm: Denies rash, itching, dry skin Psych: Denies depression, anxiety, memory loss, confusion. No homicidal or suicidal ideation.  Heme: Denies bruising, bleeding, and enlarged lymph nodes.  Physical Exam: There were no vitals taken for this visit. General:   Alert and oriented. No distress noted. Pleasant and cooperative.  Head:  Normocephalic and atraumatic. Eyes:  Conjuctiva clear without scleral icterus. Mouth:  Oral mucosa pink and moist. Good dentition. No lesions. Heart:  S1, S2 present without murmurs appreciated. Lungs:  Clear to auscultation bilaterally. No wheezes, rales, or rhonchi. No distress.  Abdomen:  +BS, soft, non-tender and non-distended. No rebound or guarding. No HSM or masses noted. Msk:  Symmetrical without gross deformities. Normal posture. Extremities:  Without edema. Neurologic:  Alert and  oriented x4 Psych:  Alert and cooperative. Normal mood and affect.

## 2020-10-21 ENCOUNTER — Ambulatory Visit: Payer: No Typology Code available for payment source | Admitting: Gastroenterology

## 2020-10-29 ENCOUNTER — Institutional Professional Consult (permissible substitution): Payer: No Typology Code available for payment source | Admitting: Neurology

## 2020-12-28 ENCOUNTER — Emergency Department (HOSPITAL_COMMUNITY)
Admission: EM | Admit: 2020-12-28 | Discharge: 2020-12-29 | Disposition: A | Discharge status: Home / Self Care | Payer: No Typology Code available for payment source | Attending: Emergency Medicine | Admitting: Emergency Medicine

## 2020-12-28 ENCOUNTER — Other Ambulatory Visit: Payer: Self-pay

## 2020-12-28 ENCOUNTER — Encounter (HOSPITAL_COMMUNITY): Payer: Self-pay | Admitting: *Deleted

## 2020-12-28 DIAGNOSIS — F17210 Nicotine dependence, cigarettes, uncomplicated: Secondary | ICD-10-CM | POA: Diagnosis not present

## 2020-12-28 DIAGNOSIS — Z7982 Long term (current) use of aspirin: Secondary | ICD-10-CM | POA: Insufficient documentation

## 2020-12-28 DIAGNOSIS — R109 Unspecified abdominal pain: Secondary | ICD-10-CM | POA: Diagnosis present

## 2020-12-28 DIAGNOSIS — I10 Essential (primary) hypertension: Secondary | ICD-10-CM | POA: Diagnosis not present

## 2020-12-28 HISTORY — DX: Essential (primary) hypertension: I10

## 2020-12-28 NOTE — ED Triage Notes (Signed)
Pt with left flank pain since last night. Pt noted his urine dark in color. Denies any burning with urination.

## 2020-12-29 ENCOUNTER — Emergency Department (HOSPITAL_COMMUNITY): Payer: No Typology Code available for payment source

## 2020-12-29 LAB — CBC WITH DIFFERENTIAL/PLATELET
Abs Immature Granulocytes: 0.03 10*3/uL (ref 0.00–0.07)
Basophils Absolute: 0 10*3/uL (ref 0.0–0.1)
Basophils Relative: 1 %
Eosinophils Absolute: 0.2 10*3/uL (ref 0.0–0.5)
Eosinophils Relative: 2 %
HCT: 40.3 % (ref 39.0–52.0)
Hemoglobin: 14.1 g/dL (ref 13.0–17.0)
Immature Granulocytes: 0 %
Lymphocytes Relative: 26 %
Lymphs Abs: 2.2 10*3/uL (ref 0.7–4.0)
MCH: 33.3 pg (ref 26.0–34.0)
MCHC: 35 g/dL (ref 30.0–36.0)
MCV: 95 fL (ref 80.0–100.0)
Monocytes Absolute: 1 10*3/uL (ref 0.1–1.0)
Monocytes Relative: 11 %
Neutro Abs: 5 10*3/uL (ref 1.7–7.7)
Neutrophils Relative %: 60 %
Platelets: 170 10*3/uL (ref 150–400)
RBC: 4.24 MIL/uL (ref 4.22–5.81)
RDW: 12.1 % (ref 11.5–15.5)
WBC: 8.4 10*3/uL (ref 4.0–10.5)
nRBC: 0 % (ref 0.0–0.2)

## 2020-12-29 LAB — COMPREHENSIVE METABOLIC PANEL
ALT: 33 U/L (ref 0–44)
AST: 50 U/L — ABNORMAL HIGH (ref 15–41)
Albumin: 3.9 g/dL (ref 3.5–5.0)
Alkaline Phosphatase: 83 U/L (ref 38–126)
Anion gap: 5 (ref 5–15)
BUN: 19 mg/dL (ref 6–20)
CO2: 25 mmol/L (ref 22–32)
Calcium: 8.5 mg/dL — ABNORMAL LOW (ref 8.9–10.3)
Chloride: 107 mmol/L (ref 98–111)
Creatinine, Ser: 0.76 mg/dL (ref 0.61–1.24)
GFR, Estimated: >60 mL/min (ref >60)
Glucose, Bld: 105 mg/dL — ABNORMAL HIGH (ref 70–99)
Potassium: 3.5 mmol/L (ref 3.5–5.1)
Sodium: 137 mmol/L (ref 135–145)
Total Bilirubin: 1.2 mg/dL (ref 0.3–1.2)
Total Protein: 6.2 g/dL — ABNORMAL LOW (ref 6.5–8.1)

## 2020-12-29 LAB — URINALYSIS, ROUTINE W REFLEX MICROSCOPIC
Bilirubin Urine: NEGATIVE
Glucose, UA: NEGATIVE mg/dL
Hgb urine dipstick: NEGATIVE
Ketones, ur: NEGATIVE mg/dL
Leukocytes,Ua: NEGATIVE
Nitrite: NEGATIVE
Protein, ur: NEGATIVE mg/dL
Specific Gravity, Urine: 1.016 (ref 1.005–1.030)
pH: 6 (ref 5.0–8.0)

## 2020-12-29 LAB — LIPASE, BLOOD: Lipase: 47 U/L (ref 11–51)

## 2020-12-29 IMAGING — CT CT RENAL STONE PROTOCOL
2 of 4 series · 17 of 46 positions shown, 19 images · non-contrast
Comparison: None.

CLINICAL DATA: Left flank pain

EXAM:
CT ABDOMEN AND PELVIS WITHOUT CONTRAST
TECHNIQUE: Multidetector CT imaging of the abdomen and pelvis was performed
following the standard protocol without IV contrast.

[Series 2: axial st · axial · 0.67mm/px · z∈[-484,-79]mm · 14 of 93 slices shown, 16 images]
[im 6/93  soft-tissue]
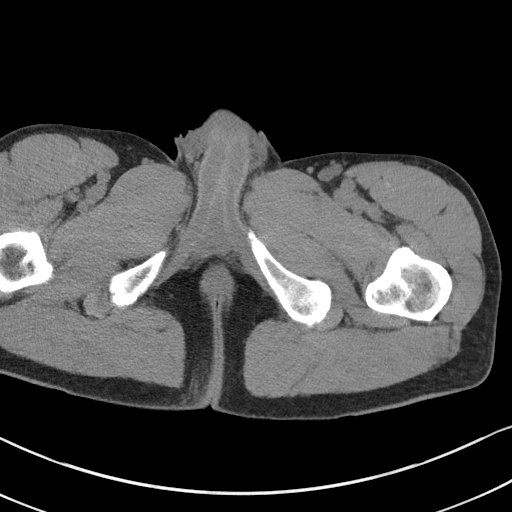
[im 6/93  bone]
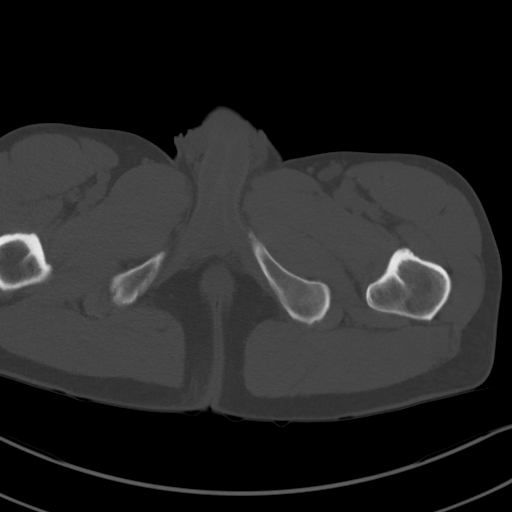
[im 11/93  soft-tissue]
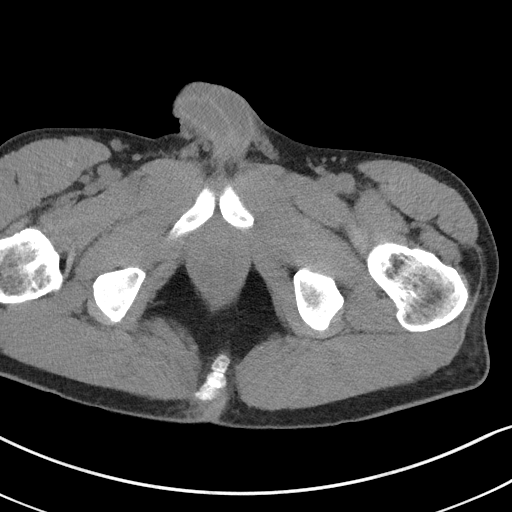
[im 17/93  soft-tissue]
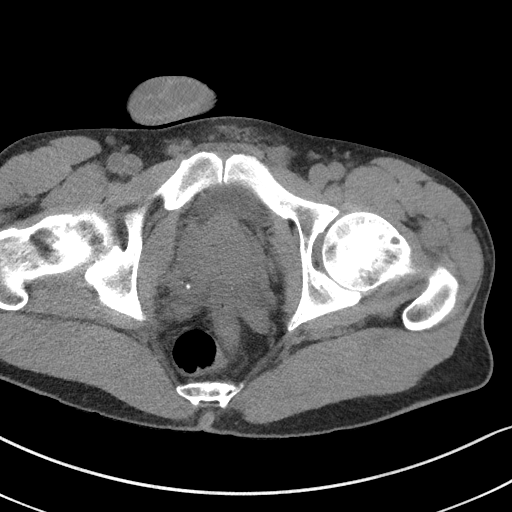
[im 28/93  soft-tissue]
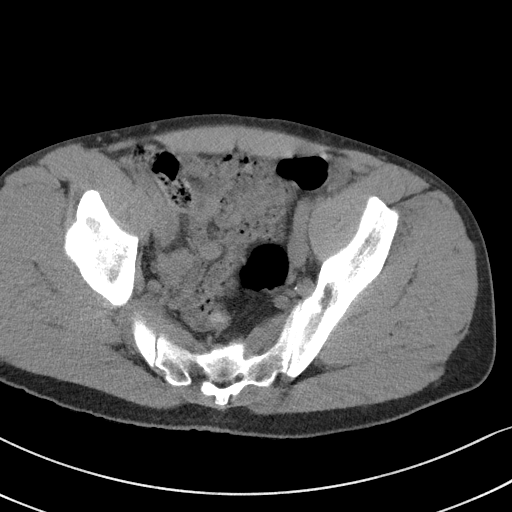
[im 33/93  soft-tissue]
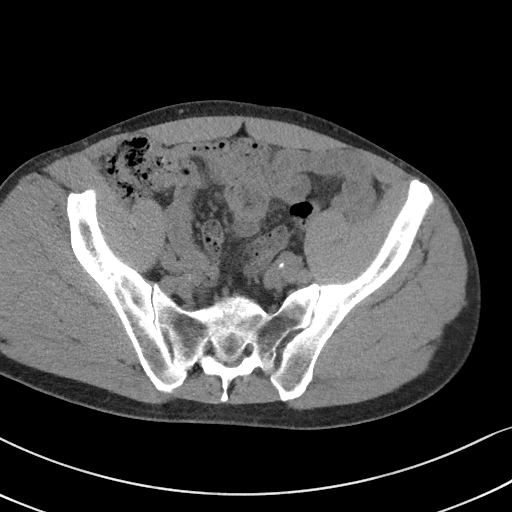
[im 38/93  soft-tissue]
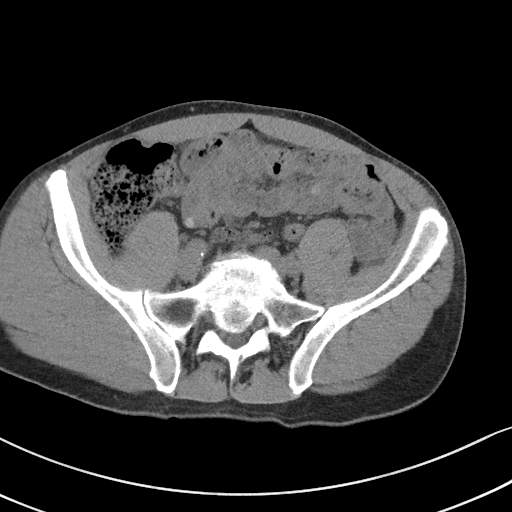
[im 44/93  soft-tissue]
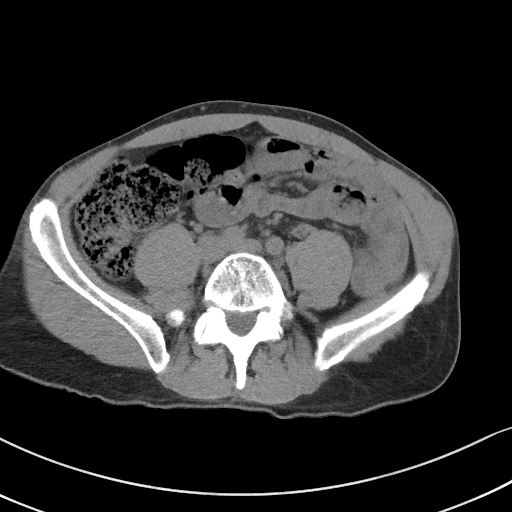
[im 49/93  soft-tissue]
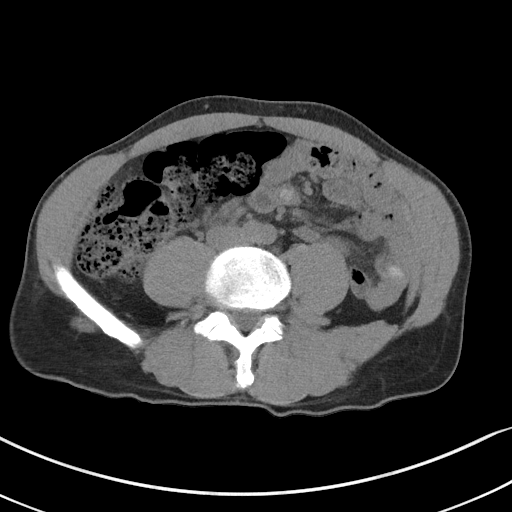
[im 55/93  soft-tissue]
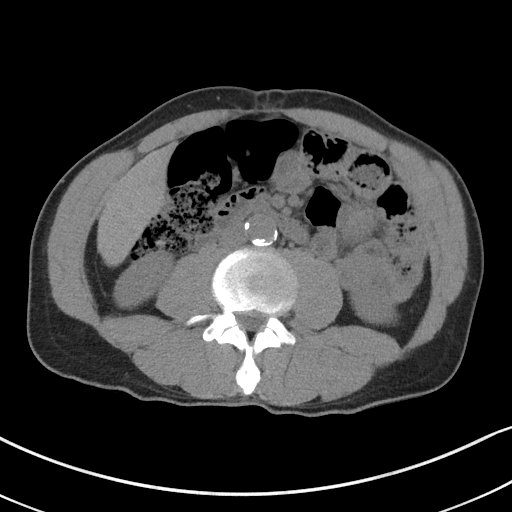
[im 55/93  bone]
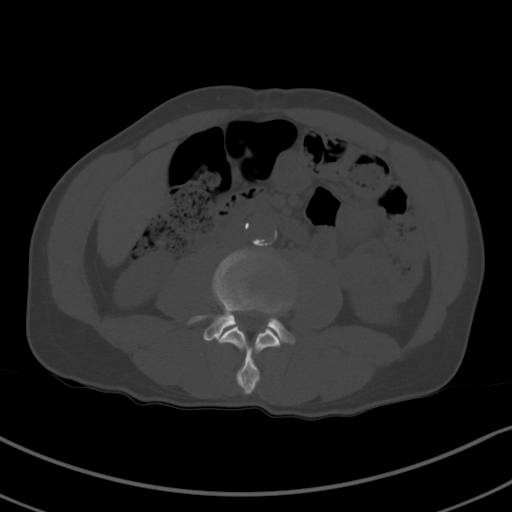
[im 60/93  soft-tissue]
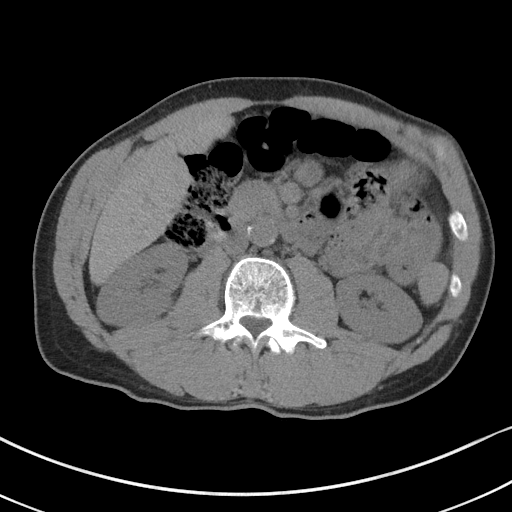
[im 71/93  soft-tissue]
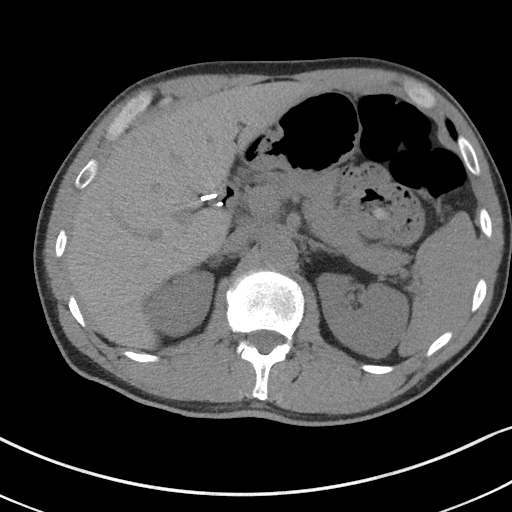
[im 76/93  soft-tissue]
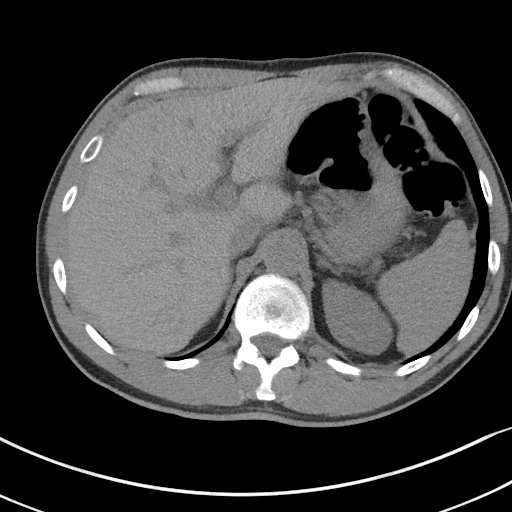
[im 82/93  soft-tissue]
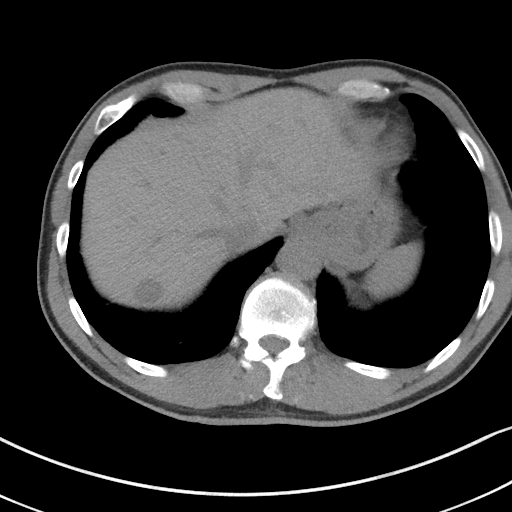
[im 87/93  soft-tissue]
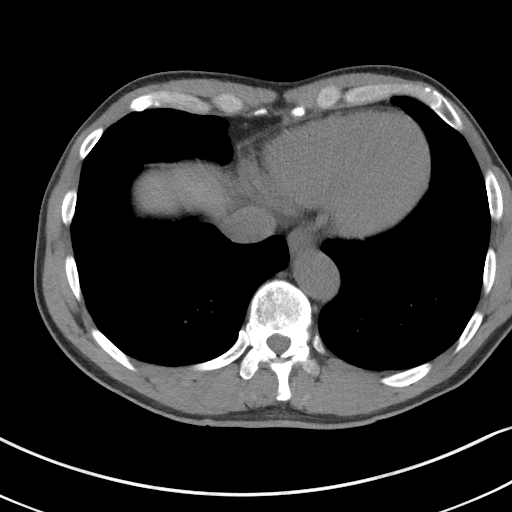

[Series 5: coronal st · coronal · 0.78mm/px · 3 of 101 slices shown]
[im 34/101  soft-tissue]
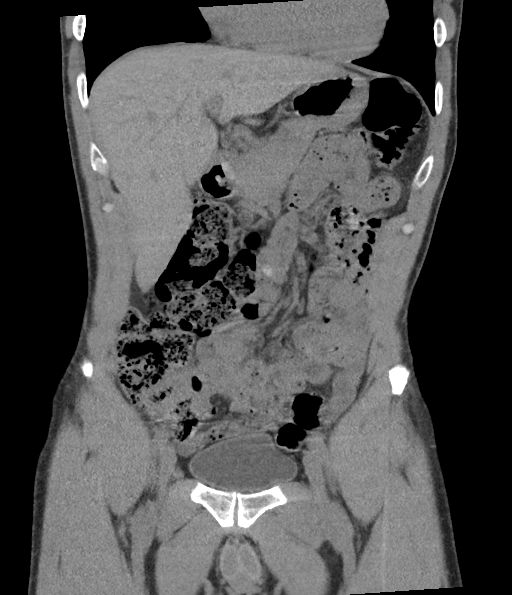
[im 45/101  soft-tissue]
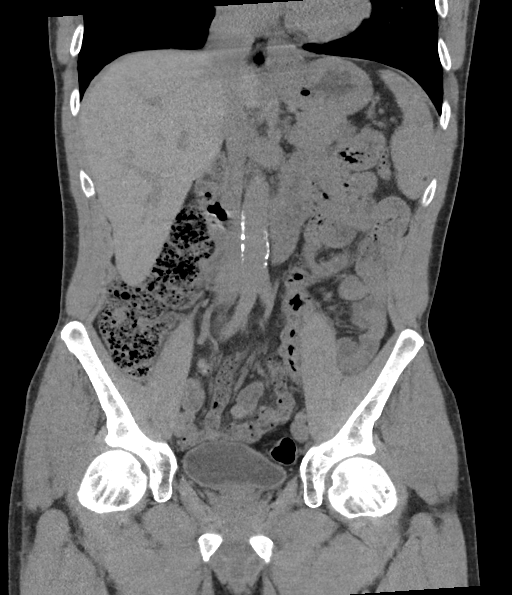
[im 56/101  soft-tissue]
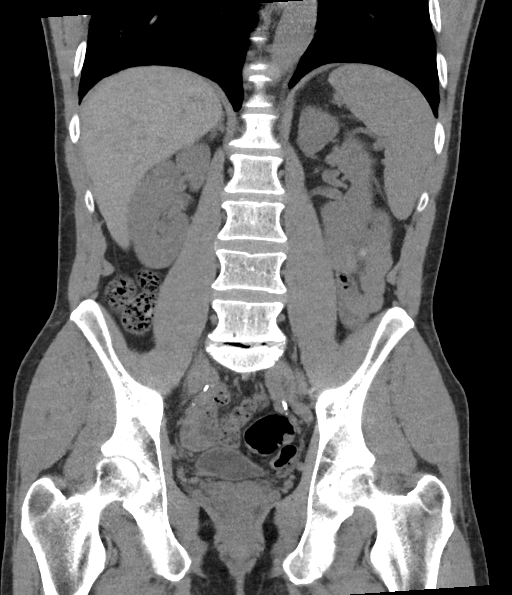

[17 of 46 positions shown; findings below may reference images not displayed]

FINDINGS: LOWER CHEST: Normal.

HEPATOBILIARY: Normal hepatic contours. No intra- or extrahepatic
biliary dilatation. Status post cholecystectomy.

PANCREAS: Normal pancreas. No ductal dilatation or peripancreatic
fluid collection.

SPLEEN: Normal.

ADRENALS/URINARY TRACT: The adrenal glands are normal. No
hydronephrosis, nephroureterolithiasis or solid renal mass. The
urinary bladder is normal for degree of distention

STOMACH/BOWEL: There is no hiatal hernia. Normal duodenal course and
caliber. No small bowel dilatation or inflammation. No focal colonic
abnormality. The appendix is not visualized. No right lower quadrant
inflammation or free fluid.

VASCULAR/LYMPHATIC: There is calcific atherosclerosis of the
abdominal aorta. No lymphadenopathy.

REPRODUCTIVE: Normal prostate size with symmetric seminal vesicles.

MUSCULOSKELETAL. No bony spinal canal stenosis or focal osseous
abnormality.

OTHER: None.
IMPRESSION: 1. No acute abnormality of the abdomen or pelvis.

Aortic Atherosclerosis ([CE]-[CE]).

## 2020-12-29 MED ORDER — LACTATED RINGERS IV BOLUS
1000.0000 mL | Freq: Once | INTRAVENOUS | Status: AC
  Administered 2020-12-29: 1000 mL via INTRAVENOUS

## 2020-12-29 MED ORDER — ONDANSETRON 8 MG PO TBDP: 8.0000 mg | ORAL_TABLET | Freq: Once | ORAL | Status: DC

## 2020-12-29 MED ORDER — MELOXICAM 7.5 MG PO TABS: 7.5000 mg | ORAL_TABLET | Freq: Every day | ORAL | 0 refills | Status: DC

## 2020-12-29 MED ORDER — KETOROLAC TROMETHAMINE 30 MG/ML IJ SOLN
30.0000 mg | Freq: Once | INTRAMUSCULAR | Status: AC
  Administered 2020-12-29: 30 mg via INTRAVENOUS

## 2020-12-29 MED ORDER — DIAZEPAM 5 MG/ML IJ SOLN
INTRAMUSCULAR | Status: AC
  Filled 2020-12-29: qty 2

## 2020-12-29 MED ORDER — HYDROCODONE-ACETAMINOPHEN 5-325 MG PO TABS: 2.0000 | ORAL_TABLET | Freq: Once | ORAL | Status: DC

## 2020-12-29 MED ORDER — FENTANYL CITRATE PF 50 MCG/ML IJ SOSY
50.0000 ug | PREFILLED_SYRINGE | Freq: Once | INTRAMUSCULAR | Status: AC
  Administered 2020-12-29: 50 ug via INTRAVENOUS
  Filled 2020-12-29: qty 1

## 2020-12-29 MED ORDER — ONDANSETRON HCL 4 MG/2ML IJ SOLN
4.0000 mg | Freq: Once | INTRAMUSCULAR | Status: AC
  Administered 2020-12-29: 4 mg via INTRAVENOUS
  Filled 2020-12-29: qty 2

## 2020-12-29 MED ORDER — CYCLOBENZAPRINE HCL 10 MG PO TABS: 10.0000 mg | ORAL_TABLET | Freq: Two times a day (BID) | ORAL | 0 refills | Status: DC | PRN

## 2020-12-29 MED ORDER — DIAZEPAM 5 MG/ML IJ SOLN
2.5000 mg | Freq: Once | INTRAMUSCULAR | Status: AC
  Administered 2020-12-29: 2.5 mg via INTRAVENOUS

## 2020-12-29 MED ORDER — DIAZEPAM 2 MG PO TABS: 2.0000 mg | ORAL_TABLET | Freq: Once | ORAL | Status: DC

## 2020-12-29 NOTE — ED Provider Notes (Signed)
[facility] EMERGENCY DEPARTMENT Provider Note   CSN: [id number] Arrival date & time: 12/28/20  2138     History Chief Complaint  Patient presents with   Flank Pain    Chad Mcintyre is a 55 y.o. male.  55 year old male the presents to the emergency department today with a couple days of left flank pain.  Also has had some dark urine during that time.  No blood in his urine.  Patient states this started after he was working out in the yard.  States he thought initially by his been a pulled muscle but then it persisted and has gotten better so he presents here for further evaluation.  He is worried about his kidney secondary to the dark urine.  No history of kidney stones.  No other trauma.  No fevers.  A little bit of dysuria but nothing consistent.   Flank Pain      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   PFO; "injection of contrast documented an interatrial shunt" on 09/19/14 TEE at [facility]; bubble study 05/12/2020 with at least medium-sized right to left cardiac shunt   Paresthesia 11/18/2019   Sleep apnea    per patient does not wear CPAP     Patient Active Problem List   Diagnosis Date Noted   Colon cancer screening 06/10/2020   Right to left cardiac shunt (HCC) 06/10/2020   NSAID long-term use 06/10/2020   PFO (patent foramen ovale)    Paresthesia 11/18/2019   Dysphagia 02/17/2019    Past Surgical History:  Procedure Laterality Date   ANEURYSM COILING     per patient few years ago   BACK SURGERY     CHOLECYSTECTOMY     HERNIA REPAIR     HIATAL HERNIA REPAIR     LAMINECTOMY AND MICRODISCECTOMY SPINE     X2   RADIOLOGY WITH ANESTHESIA N/A 12/31/2019   Procedure: MRI WITH  ANESTHESIA OF CERVICAL SPINE WITHOUT CONTRAST;  Surgeon: Radiologist, Medication, MD;  Location: [facility] OR;  Service: Radiology;  Laterality: N/A;   RADIOLOGY WITH ANESTHESIA N/A 02/04/2020   Procedure: MRI WITH ANESTHESIA  BRAIN WITH AND WITHOUT CONTRAST;  Surgeon: Radiologist, Medication, MD;  Location: [facility] OR;  Service: Radiology;  Laterality: N/A;   TONSILLECTOMY         Family History  Problem Relation Age of Onset   Kidney cancer Mother    Stomach cancer Father    Kidney cancer Brother    Colon cancer Neg Hx     Social History   Tobacco Use   Smoking status: Every Day    Packs/day: 0.50    Years: 40.00    Pack years: 20.00    Types: Cigarettes    Last attempt to quit: 02/14/2019    Years since quitting: 1.8   Smokeless tobacco: Former   Tobacco comments:    per patient about three-quarters packs/day  Vaping Use   Vaping Use: Never used  Substance Use Topics   Alcohol use: Yes    Comment: occasioanally   Drug use: Not Currently    Home Medications Prior to Admission medications   Medication Sig Start Date End  Date Taking? Authorizing Provider  cyclobenzaprine (FLEXERIL) 10 MG tablet Take 1 tablet (10 mg total) by mouth 2 (two) times daily as needed for muscle spasms. 12/29/20  Yes Timothy Townsel, Corene Cornea, MD  meloxicam (MOBIC) 7.5 MG tablet Take 1 tablet (7.5 mg total) by mouth daily. 12/29/20  Yes Leani Myron, Corene Cornea, MD  acetaminophen-codeine (TYLENOL #3) 300-30 MG tablet Take 1 tablet by mouth every 6 (six) hours as needed for moderate pain. 12/16/19   Ward, Delice Bison, DO  Ascorbic Acid (VITAMIN C) 1000 MG tablet Take 1,000 mg by mouth daily.    [provider]  aspirin 81 MG chewable tablet Chew 81 mg by mouth daily.    [provider]  Cholecalciferol (VITAMIN D3 PO) Take 1 tablet by mouth daily.    [provider]  diazepam (VALIUM) 5 MG tablet Take 1 tablet (5 mg total) by mouth every 8 (eight) hours as needed for anxiety. Patient not taking: No sig  reported 12/16/19   Ward, Cyril Mourning N, DO  ibuprofen (ADVIL) 200 MG tablet Take 1,200-1,400 mg by mouth every 8 (eight) hours as needed (pain.).    [provider]  Multiple Vitamins-Minerals (ZINC PO) Take 1 tablet by mouth daily.    [provider]  Tiotropium Bromide-Olodaterol (STIOLTO RESPIMAT) 2.5-2.5 MCG/ACT AERS Inhale 2 puffs into the lungs daily.    [provider]  topiramate (TOPAMAX) 25 MG tablet Take 1 tablet (25 mg total) by mouth at bedtime. Patient not taking: No sig reported 06/11/20 06/11/21  Garvin Fila, MD  zinc gluconate 50 MG tablet Take 50 mg by mouth daily.    [provider]    Allergies    Oxycodone-acetaminophen, Depakote [divalproex sodium], Oxycodone hcl, and Valproic acid  Review of Systems   Review of Systems  Genitourinary:  Positive for flank pain.  All other systems reviewed and are negative.  Physical Exam Updated Vital Signs BP 120/75   Pulse 90   Temp 98 F (36.7 C) (Oral)   Resp 18   Ht 6\' 1"  (1.854 m)   Wt 83.9 kg   SpO2 100%   BMI 24.41 kg/m   Physical Exam Vitals and nursing note reviewed.  Constitutional:      Appearance: He is well-developed.  HENT:     Head: Normocephalic and atraumatic.     Nose: Nose normal. No congestion or rhinorrhea.     Mouth/Throat:     Mouth: Mucous membranes are moist.     Pharynx: Oropharynx is clear.  Eyes:     Pupils: Pupils are equal, round, and reactive to light.  Cardiovascular:     Rate and Rhythm: Normal rate.  Pulmonary:     Effort: Pulmonary effort is normal. No respiratory distress.  Abdominal:     General: Abdomen is flat. There is no distension.  Musculoskeletal:        General: Tenderness (Mild tenderness to his left flank but not reproducible consistently.) present. Normal range of motion.     Cervical back: Normal range of motion.  Skin:    General: Skin is warm and dry.     Coloration: Skin is not jaundiced or pale.  Neurological:      General: No focal deficit present.     Mental Status: He is alert.    ED Results / Procedures / Treatments   Labs (all labs ordered are listed, but only abnormal results are displayed) Labs Reviewed  COMPREHENSIVE METABOLIC PANEL - Abnormal; Notable for the following components:  Result Value   Glucose, Bld 105 (*)    Calcium 8.5 (*)    Total Protein 6.2 (*)    AST 50 (*)    All other components within normal limits  CBC WITH DIFFERENTIAL/PLATELET  URINALYSIS, ROUTINE W REFLEX MICROSCOPIC  LIPASE, BLOOD    EKG None  Radiology CT Renal Stone Study  Result Date: 12/29/2020 CLINICAL DATA:  Left flank pain EXAM: CT ABDOMEN AND PELVIS WITHOUT CONTRAST TECHNIQUE: Multidetector CT imaging of the abdomen and pelvis was performed following the standard protocol without IV contrast. COMPARISON:  None. FINDINGS: LOWER CHEST: Normal. HEPATOBILIARY: Normal hepatic contours. No intra- or extrahepatic biliary dilatation. Status post cholecystectomy. PANCREAS: Normal pancreas. No ductal dilatation or peripancreatic fluid collection. SPLEEN: Normal. ADRENALS/URINARY TRACT: The adrenal glands are normal. No hydronephrosis, nephroureterolithiasis or solid renal mass. The urinary bladder is normal for degree of distention STOMACH/BOWEL: There is no hiatal hernia. Normal duodenal course and caliber. No small bowel dilatation or inflammation. No focal colonic abnormality. The appendix is not visualized. No right lower quadrant inflammation or free fluid. VASCULAR/LYMPHATIC: There is calcific atherosclerosis of the abdominal aorta. No lymphadenopathy. REPRODUCTIVE: Normal prostate size with symmetric seminal vesicles. MUSCULOSKELETAL. No bony spinal canal stenosis or focal osseous abnormality. OTHER: None. IMPRESSION: 1. No acute abnormality of the abdomen or pelvis. Aortic Atherosclerosis (ICD10-I70.0). Electronically Signed   By: Ulyses Jarred M.D.   On: 12/29/2020 01:32    Procedures Procedures    Medications Ordered in ED Medications  lactated ringers bolus 1,000 mL (0 mLs Intravenous Stopped 12/29/20 0339)  ondansetron (ZOFRAN) injection 4 mg (4 mg Intravenous Given 12/29/20 0109)  fentaNYL (SUBLIMAZE) injection 50 mcg (50 mcg Intravenous Given 12/29/20 0146)  ketorolac (TORADOL) 30 MG/ML injection 30 mg (30 mg Intravenous Given 12/29/20 0330)  lactated ringers bolus 1,000 mL (0 mLs Intravenous Stopped 12/29/20 0528)  diazepam (VALIUM) injection 2.5 mg (2.5 mg Intravenous Given 12/29/20 0330)    ED Course  I have reviewed the triage vital signs and the nursing notes.  Pertinent labs & imaging results that were available during my care of the patient were reviewed by me and considered in my medical decision making (see chart for details).    MDM Rules/Calculators/A&P                         Patient urinated while was at bedside.  He looks more just dark secondary dehydration.  Does not appear to be hematuria.  Will evaluate for kidney stone but is very well could just be a pulled muscle with dehydration.  No evidence of kidney stone. Pain improved. Muscle relaxers/hydration suggested for home care.   Final Clinical Impression(s) / ED Diagnoses Final diagnoses:  Flank pain    Rx / DC Orders ED Discharge Orders          Ordered    cyclobenzaprine (FLEXERIL) 10 MG tablet  2 times daily PRN        12/29/20 0446    meloxicam (MOBIC) 7.5 MG tablet  Daily        12/29/20 0446             Alla Sloma, Corene Cornea, MD 12/30/20 [phone]

## 2022-12-13 ENCOUNTER — Encounter: Payer: Self-pay | Admitting: Emergency Medicine

## 2022-12-13 ENCOUNTER — Ambulatory Visit: Payer: No Typology Code available for payment source

## 2022-12-13 ENCOUNTER — Ambulatory Visit
Admission: EM | Admit: 2022-12-13 | Discharge: 2022-12-13 | Disposition: A | Discharge status: Home / Self Care | Payer: No Typology Code available for payment source

## 2022-12-13 DIAGNOSIS — J441 Chronic obstructive pulmonary disease with (acute) exacerbation: Secondary | ICD-10-CM

## 2022-12-13 LAB — POC COVID19/FLU A&B COMBO
Covid Antigen, POC: NEGATIVE
Influenza A Antigen, POC: NEGATIVE
Influenza B Antigen, POC: NEGATIVE

## 2022-12-13 MED ORDER — AZITHROMYCIN 250 MG PO TABS: ORAL_TABLET | ORAL | 0 refills | Status: AC

## 2022-12-13 MED ORDER — BENZONATATE 100 MG PO CAPS: 100.0000 mg | ORAL_CAPSULE | Freq: Three times a day (TID) | ORAL | 0 refills | Status: AC | PRN

## 2022-12-13 NOTE — Discharge Instructions (Addendum)
I will contact you later today if the chest xray shows that we need to add in another antibiotic.  In the meantime, start taking the azithromycin as prescribed to treat for a lung infection.   Symptoms should improve over the next week to 10 days.  If you develop chest pain or shortness of breath, go to the emergency room.  COVID-19 and influenza tests are negative today.  Some things that can make you feel better are: - Increased rest - Increasing fluid with water/sugar free electrolytes - Acetaminophen and ibuprofen as needed for fever/pain - Salt water gargling, chloraseptic spray and throat lozenges - OTC guaifenesin (Mucinex) 600 mg twice daily for congestion - Saline sinus flushes or a neti pot - Humidifying the air

## 2022-12-13 NOTE — ED Triage Notes (Signed)
Chest congestion, nausea, headache, fever, body aches since Saturday .  Has been taking cold and flu medication without relief.

## 2022-12-13 NOTE — ED Provider Notes (Signed)
[facility]    CSN: [id number] Arrival date & time: 12/13/22  0846      History   Chief Complaint No chief complaint on file.   HPI Chad Mcintyre is a 57 y.o. male.   Patient presents today with 4-day history of tactile fevers, body aches and chills, congested cough with production of thick, green mucus, shortness of breath with exertion, chest and nasal congestion, nausea without vomiting, decreased appetite, and fatigue.  No chest pain, postnasal drainage or sore throat, headache, ear pain, vomiting, or diarrhea.  Has taken over-the-counter cold and flu medication without much improvement.  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  Reports he is a current smoker, takes Stiolto to "prevent" COPD.    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   PFO; "injection of contrast documented an interatrial shunt" on 09/19/14 TEE at [facility]; bubble study 05/12/2020 with at least medium-sized right to left cardiac shunt   Paresthesia 11/18/2019   Sleep apnea    per patient does not wear CPAP     Patient Active Problem List   Diagnosis Date Noted   Colon cancer screening 06/10/2020   Right to left cardiac shunt 06/10/2020   NSAID long-term use 06/10/2020   PFO (patent foramen ovale)    Paresthesia 11/18/2019   Dysphagia 02/17/2019    Past Surgical History:  Procedure Laterality Date   ANEURYSM COILING     per patient few years ago   BACK SURGERY     CHOLECYSTECTOMY     HERNIA REPAIR     HIATAL HERNIA REPAIR     LAMINECTOMY AND MICRODISCECTOMY SPINE     X2   RADIOLOGY WITH ANESTHESIA N/A 12/31/2019   Procedure: MRI WITH ANESTHESIA OF CERVICAL SPINE WITHOUT CONTRAST;  Surgeon: Radiologist,  Medication, MD;  Location: [facility] OR;  Service: Radiology;  Laterality: N/A;   RADIOLOGY WITH ANESTHESIA N/A 02/04/2020   Procedure: MRI WITH ANESTHESIA  BRAIN WITH AND WITHOUT CONTRAST;  Surgeon: Radiologist, Medication, MD;  Location: [facility] OR;  Service: Radiology;  Laterality: N/A;   TONSILLECTOMY         Home Medications    Prior to Admission medications   Medication Sig Start Date End Date Taking? Authorizing Provider  azithromycin (ZITHROMAX) 250 MG tablet Take (2) tablets by mouth on day 1, then take (1) tablet by mouth on days 2-5. 12/13/22  Yes Valentino Nose, NP  benzonatate (TESSALON) 100 MG capsule Take 1 capsule (100 mg total) by mouth 3 (three) times daily as needed for cough. Do not take with alcohol or while driving or operating heavy machinery.  May cause drowsiness. 12/13/22  Yes Cathlean Marseilles A, NP  lisinopril (ZESTRIL) 5 MG tablet Take 5 mg by mouth daily.   Yes [provider]  acetaminophen-codeine (TYLENOL #3) 300-30 MG tablet Take 1 tablet by mouth every 6 (six) hours as needed for moderate pain. 12/16/19   Ward, Layla Maw, DO  Ascorbic Acid (VITAMIN C) 1000 MG tablet Take 1,000 mg by mouth daily.    [provider]  aspirin 81 MG chewable tablet Chew 81 mg by mouth daily.    [provider]  Cholecalciferol (VITAMIN D3 PO) Take 1 tablet by mouth daily.    [provider]  ibuprofen (ADVIL) 200 MG tablet Take 1,200-1,400 mg by mouth every 8 (eight) hours as needed (pain.).    [provider]  Multiple Vitamins-Minerals (ZINC PO) Take 1 tablet by mouth daily.    [provider]  Tiotropium Bromide-Olodaterol (STIOLTO RESPIMAT) 2.5-2.5 MCG/ACT AERS Inhale 2 puffs into the lungs daily.    [provider]  zinc gluconate 50 MG tablet Take 50 mg by mouth daily.    [provider]    Family History Family History  Problem Relation Age of Onset   Kidney cancer Mother    Stomach cancer Father     Kidney cancer Brother    Colon cancer Neg Hx     Social History Social History   Tobacco Use   Smoking status: Every Day    Current packs/day: 0.00    Average packs/day: 0.5 packs/day for 40.0 years (20.0 ttl pk-yrs)    Types: Cigarettes    Start date: 02/14/1979    Last attempt to quit: 02/14/2019    Years since quitting: 3.8   Smokeless tobacco: Former   Tobacco comments:    per patient about three-quarters packs/day  Vaping Use   Vaping status: Never Used  Substance Use Topics   Alcohol use: Yes    Comment: occasioanally   Drug use: Not Currently     Allergies   Oxycodone-acetaminophen, Depakote [divalproex sodium], Oxycodone hcl, and Valproic acid   Review of Systems Review of Systems Per HPI  Physical Exam Triage Vital Signs ED Triage Vitals  Encounter Vitals Group     BP 12/13/22 0908 118/78     Systolic BP Percentile --      Diastolic BP Percentile --      Pulse Rate 12/13/22 0908 (!) 118     Resp 12/13/22 0908 (!) 22     Temp 12/13/22 0908 99.3 F (37.4 C)     Temp Source 12/13/22 0908 Oral     SpO2 12/13/22 0908 94 %     Weight --      Height --      Head Circumference --      Peak Flow --      Pain Score 12/13/22 0909 4     Pain Loc --      Pain Education --      Exclude from Growth Chart --    No data found.  Updated Vital Signs BP 118/78 (BP Location: Right Arm)   Pulse (!) 118   Temp 99.3 F (37.4 C) (Oral)   Resp (!) 22   SpO2 94%   Visual Acuity Right Eye Distance:   Left Eye Distance:   Bilateral Distance:    Right Eye Near:   Left Eye Near:    Bilateral Near:     Physical Exam Vitals and nursing note reviewed.  Constitutional:      General: He is not in acute distress.    Appearance: Normal appearance. He is not ill-appearing or toxic-appearing.  HENT:     Head: Normocephalic and atraumatic.     Right Ear: Tympanic membrane, ear canal and external ear normal.     Left Ear: Tympanic membrane, ear canal and external ear  normal.     Nose: No congestion or rhinorrhea.     Mouth/Throat:     Mouth: Mucous membranes are moist.     Pharynx: Oropharynx is clear. Posterior oropharyngeal erythema  present. No oropharyngeal exudate.  Eyes:     General: No scleral icterus.    Extraocular Movements: Extraocular movements intact.  Cardiovascular:     Rate and Rhythm: Regular rhythm. Tachycardia present.  Pulmonary:     Effort: Pulmonary effort is normal. No respiratory distress.     Breath sounds: Normal breath sounds. No wheezing, rhonchi or rales.  Musculoskeletal:     Cervical back: Normal range of motion and neck supple.  Lymphadenopathy:     Cervical: No cervical adenopathy.  Skin:    General: Skin is warm and dry.     Coloration: Skin is not jaundiced or pale.     Findings: No erythema or rash.  Neurological:     Mental Status: He is alert and oriented to person, place, and time.  Psychiatric:        Behavior: Behavior is cooperative.      UC Treatments / Results  Labs (all labs ordered are listed, but only abnormal results are displayed) Labs Reviewed  POC COVID19/FLU A&B COMBO    EKG   Radiology No results found.  Procedures Procedures (including critical care time)  Medications Ordered in UC Medications - No data to display  Initial Impression / Assessment and Plan / UC Course  I have reviewed the triage vital signs and the nursing notes.  Pertinent labs & imaging results that were available during my care of the patient were reviewed by me and considered in my medical decision making (see chart for details).   Patient is well-appearing, normotensive, oxygenating well on room air.  Patient has a low-grade fever in triage and is mildly tachycardic, which are likely related to acute illness.  He is mildly tachypneic which improved with rest.  1. Bronchitis, chronic obstructive, with exacerbation (HCC) Likely viral etiology Overall, vitals and exam are reassuring COVID-19, influenza  tests are negative Chest x-ray is pending at time of discharge, will contact patient if chest x-ray shows pneumonia In meantime, treat for COPD exacerbation with azithromycin, prednisone deferred given no wheezing Other supportive care discussed including cough suppressant medication Return and ER precautions discussed Work excuse provided  The patient was given the opportunity to ask questions.  All questions answered to their satisfaction.  The patient is in agreement to this plan.    Final Clinical Impressions(s) / UC Diagnoses   Final diagnoses:  Bronchitis, chronic obstructive, with exacerbation [facility])     Discharge Instructions      I will contact you later today if the chest xray shows that we need to add in another antibiotic.  In the meantime, start taking the azithromycin as prescribed to treat for a lung infection.   Symptoms should improve over the next week to 10 days.  If you develop chest pain or shortness of breath, go to the emergency room.  COVID-19 and influenza tests are negative today.  Some things that can make you feel better are: - Increased rest - Increasing fluid with water/sugar free electrolytes - Acetaminophen and ibuprofen as needed for fever/pain - Salt water gargling, chloraseptic spray and throat lozenges - OTC guaifenesin (Mucinex) 600 mg twice daily for congestion - Saline sinus flushes or a neti pot - Humidifying the air     ED Prescriptions     Medication Sig Dispense Auth. Provider   azithromycin (ZITHROMAX) 250 MG tablet Take (2) tablets by mouth on day 1, then take (1) tablet by mouth on days 2-5. 6 tablet Valentino Nose, NP   benzonatate (  TESSALON) 100 MG capsule Take 1 capsule (100 mg total) by mouth 3 (three) times daily as needed for cough. Do not take with alcohol or while driving or operating heavy machinery.  May cause drowsiness. 21 capsule Valentino Nose, NP      PDMP not reviewed this encounter.   Valentino Nose, NP 12/13/22 1008
